# Patient Record
Sex: Male | Born: 1975
Health system: Southern US, Community
[De-identification: ages and names within clinical notes are randomized; demographics above are authoritative.]

## PROBLEM LIST (undated history)

## (undated) DIAGNOSIS — R112 Nausea with vomiting, unspecified: Secondary | ICD-10-CM

## (undated) DIAGNOSIS — I10 Essential (primary) hypertension: Secondary | ICD-10-CM

## (undated) DIAGNOSIS — K219 Gastro-esophageal reflux disease without esophagitis: Secondary | ICD-10-CM

## (undated) DIAGNOSIS — R7303 Prediabetes: Secondary | ICD-10-CM

## (undated) DIAGNOSIS — R0602 Shortness of breath: Secondary | ICD-10-CM

## (undated) DIAGNOSIS — E785 Hyperlipidemia, unspecified: Secondary | ICD-10-CM

## (undated) DIAGNOSIS — G4730 Sleep apnea, unspecified: Secondary | ICD-10-CM

## (undated) DIAGNOSIS — K589 Irritable bowel syndrome without diarrhea: Secondary | ICD-10-CM

## (undated) DIAGNOSIS — H409 Unspecified glaucoma: Secondary | ICD-10-CM

## (undated) DIAGNOSIS — M549 Dorsalgia, unspecified: Secondary | ICD-10-CM

## (undated) DIAGNOSIS — M2550 Pain in unspecified joint: Secondary | ICD-10-CM

## (undated) DIAGNOSIS — K449 Diaphragmatic hernia without obstruction or gangrene: Secondary | ICD-10-CM

## (undated) DIAGNOSIS — K829 Disease of gallbladder, unspecified: Secondary | ICD-10-CM

## (undated) DIAGNOSIS — K760 Fatty (change of) liver, not elsewhere classified: Secondary | ICD-10-CM

## (undated) DIAGNOSIS — J302 Other seasonal allergic rhinitis: Secondary | ICD-10-CM

## (undated) DIAGNOSIS — I209 Angina pectoris, unspecified: Secondary | ICD-10-CM

## (undated) DIAGNOSIS — S0300XA Dislocation of jaw, unspecified side, initial encounter: Secondary | ICD-10-CM

## (undated) DIAGNOSIS — Z9889 Other specified postprocedural states: Secondary | ICD-10-CM

## (undated) DIAGNOSIS — H9190 Unspecified hearing loss, unspecified ear: Secondary | ICD-10-CM

## (undated) HISTORY — DX: Dislocation of jaw, unspecified side, initial encounter: S03.00XA

## (undated) HISTORY — DX: Prediabetes: R73.03

## (undated) HISTORY — DX: Hyperlipidemia, unspecified: E78.5

## (undated) HISTORY — DX: Shortness of breath: R06.02

## (undated) HISTORY — PX: TONSILLECTOMY: SUR1361

## (undated) HISTORY — DX: Disease of gallbladder, unspecified: K82.9

## (undated) HISTORY — DX: Dorsalgia, unspecified: M54.9

## (undated) HISTORY — DX: Unspecified glaucoma: H40.9

## (undated) HISTORY — DX: Other seasonal allergic rhinitis: J30.2

## (undated) HISTORY — DX: Essential (primary) hypertension: I10

## (undated) HISTORY — DX: Irritable bowel syndrome, unspecified: K58.9

## (undated) HISTORY — DX: Pain in unspecified joint: M25.50

## (undated) HISTORY — DX: Fatty (change of) liver, not elsewhere classified: K76.0

## (undated) HISTORY — DX: Gastro-esophageal reflux disease without esophagitis: K21.9

## (undated) HISTORY — DX: Unspecified hearing loss, unspecified ear: H91.90

---

## 1999-10-07 ENCOUNTER — Encounter: Admission: RE | Admit: 1999-10-07 | Discharge: 1999-10-07 | Payer: Self-pay | Admitting: Otolaryngology

## 1999-10-07 ENCOUNTER — Encounter: Payer: Self-pay | Admitting: Otolaryngology

## 1999-11-24 ENCOUNTER — Other Ambulatory Visit: Admission: RE | Admit: 1999-11-24 | Discharge: 1999-11-24 | Payer: Self-pay | Admitting: Otolaryngology

## 1999-11-24 ENCOUNTER — Encounter (INDEPENDENT_AMBULATORY_CARE_PROVIDER_SITE_OTHER): Payer: Self-pay | Admitting: *Deleted

## 2000-12-04 HISTORY — PX: CHOLESTEATOMA EXCISION: SHX1345

## 2003-03-25 ENCOUNTER — Emergency Department (HOSPITAL_COMMUNITY): Admission: EM | Admit: 2003-03-25 | Discharge: 2003-03-25 | Payer: Self-pay | Admitting: Emergency Medicine

## 2003-03-25 ENCOUNTER — Encounter: Payer: Self-pay | Admitting: Emergency Medicine

## 2007-12-05 HISTORY — PX: CHOLECYSTECTOMY: SHX55

## 2010-09-14 ENCOUNTER — Encounter: Admission: RE | Admit: 2010-09-14 | Discharge: 2010-09-14 | Payer: Self-pay | Admitting: Otolaryngology

## 2011-07-14 ENCOUNTER — Other Ambulatory Visit (HOSPITAL_COMMUNITY): Payer: Self-pay | Admitting: Diagnostic Neuroimaging

## 2011-07-14 DIAGNOSIS — M542 Cervicalgia: Secondary | ICD-10-CM

## 2011-07-17 ENCOUNTER — Ambulatory Visit (HOSPITAL_COMMUNITY): Payer: Managed Care, Other (non HMO) | Attending: Diagnostic Neuroimaging | Admitting: Radiology

## 2011-07-17 DIAGNOSIS — G4730 Sleep apnea, unspecified: Secondary | ICD-10-CM | POA: Insufficient documentation

## 2011-07-17 DIAGNOSIS — R209 Unspecified disturbances of skin sensation: Secondary | ICD-10-CM

## 2011-07-17 DIAGNOSIS — M542 Cervicalgia: Secondary | ICD-10-CM

## 2011-07-17 DIAGNOSIS — K449 Diaphragmatic hernia without obstruction or gangrene: Secondary | ICD-10-CM | POA: Insufficient documentation

## 2011-07-18 ENCOUNTER — Encounter (HOSPITAL_COMMUNITY): Payer: Self-pay | Admitting: Diagnostic Neuroimaging

## 2011-10-20 ENCOUNTER — Encounter (HOSPITAL_COMMUNITY): Payer: Self-pay | Admitting: Pharmacy Technician

## 2011-10-30 ENCOUNTER — Encounter (HOSPITAL_COMMUNITY): Payer: Self-pay

## 2011-10-30 ENCOUNTER — Encounter (HOSPITAL_COMMUNITY)
Admission: RE | Admit: 2011-10-30 | Discharge: 2011-10-30 | Disposition: A | Discharge status: Home / Self Care | Payer: Managed Care, Other (non HMO) | Source: Ambulatory Visit | Attending: Otolaryngology | Admitting: Otolaryngology

## 2011-10-30 HISTORY — DX: Gastro-esophageal reflux disease without esophagitis: K21.9

## 2011-10-30 HISTORY — DX: Sleep apnea, unspecified: G47.30

## 2011-10-30 HISTORY — DX: Diaphragmatic hernia without obstruction or gangrene: K44.9

## 2011-10-30 HISTORY — DX: Shortness of breath: R06.02

## 2011-10-30 HISTORY — DX: Other specified postprocedural states: Z98.890

## 2011-10-30 HISTORY — DX: Angina pectoris, unspecified: I20.9

## 2011-10-30 HISTORY — DX: Other specified postprocedural states: R11.2

## 2011-10-30 LAB — CBC
HCT: 40.6 % (ref 39.0–52.0)
Hemoglobin: 14.3 g/dL (ref 13.0–17.0)
MCH: 30.2 pg (ref 26.0–34.0)
MCHC: 35.2 g/dL (ref 30.0–36.0)

## 2011-10-30 LAB — BASIC METABOLIC PANEL
BUN: 12 mg/dL (ref 6–23)
Chloride: 101 mEq/L (ref 96–112)
GFR calc Af Amer: >90 mL/min (ref >90)
Glucose, Bld: 89 mg/dL (ref 70–99)
Potassium: 4.6 mEq/L (ref 3.5–5.1)

## 2011-10-30 LAB — SURGICAL PCR SCREEN: Staphylococcus aureus: NEGATIVE

## 2011-10-30 NOTE — Progress Notes (Signed)
Echo done 8/12 in epic. neg

## 2011-10-30 NOTE — Pre-Procedure Instructions (Signed)
20 RUSSEL MORAIN  10/30/2011   Your procedure is scheduled on:  11/02/11  Report to [facility] at 530 AM.  Call this number if you have problems the morning of surgery: [phone]   Remember:   Do not eat food:After Midnight.  May have clear liquids: up to 4 Hours before arrival.  Clear liquids include soda, tea, black coffee, apple or grape juice, broth.  Take these medicines the morning of surgery with A SIP OF WATER: nexium, flexeril if needed  Stop fish oil and asa now if not already   Do not wear jewelry, make-up or nail polish.  Do not wear lotions, powders, or perfumes. You may wear deodorant.  Do not shave 48 hours prior to surgery.  Do not bring valuables to the hospital.  Contacts, dentures or bridgework may not be worn into surgery.  Leave suitcase in the car. After surgery it may be brought to your room.  For patients admitted to the hospital, checkout time is 11:00 AM the day of discharge.   Patients discharged the day of surgery will not be allowed to drive home.  Name and phone number of your driver:[phone]  Special Instructions: CHG Shower Use Special Wash: 1/2 bottle night before surgery and 1/2 bottle morning of surgery.   Please read over the following fact sheets that you were given: Pain Booklet, Coughing and Deep Breathing, MRSA Information and Surgical Site Infection Prevention

## 2011-11-02 ENCOUNTER — Encounter (HOSPITAL_COMMUNITY): Payer: Self-pay | Admitting: Certified Registered"

## 2011-11-02 ENCOUNTER — Encounter (HOSPITAL_COMMUNITY)
Admission: RE | Disposition: A | Discharge status: Home / Self Care | Payer: Self-pay | Source: Ambulatory Visit | Attending: Otolaryngology

## 2011-11-02 ENCOUNTER — Encounter (HOSPITAL_COMMUNITY): Payer: Self-pay

## 2011-11-02 ENCOUNTER — Ambulatory Visit (HOSPITAL_COMMUNITY): Payer: Managed Care, Other (non HMO) | Admitting: Certified Registered"

## 2011-11-02 ENCOUNTER — Encounter (HOSPITAL_COMMUNITY): Payer: Self-pay | Admitting: *Deleted

## 2011-11-02 ENCOUNTER — Observation Stay (HOSPITAL_COMMUNITY)
Admission: RE | Admit: 2011-11-02 | Discharge: 2011-11-03 | Disposition: A | Discharge status: Home / Self Care | Payer: Managed Care, Other (non HMO) | Source: Ambulatory Visit | Attending: Otolaryngology | Admitting: Otolaryngology

## 2011-11-02 DIAGNOSIS — Z01812 Encounter for preprocedural laboratory examination: Secondary | ICD-10-CM | POA: Insufficient documentation

## 2011-11-02 DIAGNOSIS — J342 Deviated nasal septum: Principal | ICD-10-CM | POA: Insufficient documentation

## 2011-11-02 DIAGNOSIS — G4733 Obstructive sleep apnea (adult) (pediatric): Secondary | ICD-10-CM | POA: Insufficient documentation

## 2011-11-02 HISTORY — PX: NASAL SEPTOPLASTY W/ TURBINOPLASTY: SHX2070

## 2011-11-02 SURGERY — SEPTOPLASTY, NOSE, WITH NASAL TURBINATE REDUCTION
Anesthesia: General | Laterality: Bilateral | Wound class: Clean Contaminated

## 2011-11-02 MED ORDER — LACTATED RINGERS IV SOLN: INTRAVENOUS | Status: DC

## 2011-11-02 MED ORDER — CYCLOBENZAPRINE HCL 5 MG PO TABS
5.0000 mg | ORAL_TABLET | Freq: Every day | ORAL | Status: DC
  Administered 2011-11-02: 10 mg via ORAL
  Filled 2011-11-02 (×2): qty 2

## 2011-11-02 MED ORDER — ONDANSETRON HCL 4 MG/2ML IJ SOLN: 4.0000 mg | INTRAMUSCULAR | Status: DC | PRN

## 2011-11-02 MED ORDER — DEXTROSE-NACL 5-0.45 % IV SOLN
INTRAVENOUS | Status: DC
  Administered 2011-11-02 (×2): via INTRAVENOUS

## 2011-11-02 MED ORDER — LIDOCAINE-EPINEPHRINE 1 %-1:100000 IJ SOLN
INTRAMUSCULAR | Status: DC | PRN
  Administered 2011-11-02: 20 mL

## 2011-11-02 MED ORDER — BACITRACIN ZINC 500 UNIT/GM EX OINT
TOPICAL_OINTMENT | CUTANEOUS | Status: DC | PRN
  Administered 2011-11-02: 1 via TOPICAL

## 2011-11-02 MED ORDER — PANTOPRAZOLE SODIUM 40 MG PO TBEC: 40.0000 mg | DELAYED_RELEASE_TABLET | Freq: Every day | ORAL | Status: DC

## 2011-11-02 MED ORDER — PROMETHAZINE HCL 25 MG/ML IJ SOLN
6.2500 mg | INTRAMUSCULAR | Status: AC | PRN
  Administered 2011-11-02 (×3): 12.5 mg via INTRAVENOUS

## 2011-11-02 MED ORDER — MIDAZOLAM HCL 5 MG/5ML IJ SOLN
INTRAMUSCULAR | Status: DC | PRN
  Administered 2011-11-02: 2 mg via INTRAVENOUS

## 2011-11-02 MED ORDER — ROCURONIUM BROMIDE 100 MG/10ML IV SOLN
INTRAVENOUS | Status: DC | PRN
  Administered 2011-11-02: 50 mg via INTRAVENOUS

## 2011-11-02 MED ORDER — CEPHALEXIN 500 MG PO CAPS
500.0000 mg | ORAL_CAPSULE | Freq: Three times a day (TID) | ORAL | Status: DC
  Administered 2011-11-02: 500 mg via ORAL
  Filled 2011-11-02 (×4): qty 1

## 2011-11-02 MED ORDER — HYDROMORPHONE HCL PF 1 MG/ML IJ SOLN
INTRAMUSCULAR | Status: AC
  Filled 2011-11-02: qty 1

## 2011-11-02 MED ORDER — KETOROLAC TROMETHAMINE 30 MG/ML IJ SOLN: 15.0000 mg | Freq: Once | INTRAMUSCULAR | Status: DC | PRN

## 2011-11-02 MED ORDER — MORPHINE SULFATE 4 MG/ML IJ SOLN
2.0000 mg | INTRAMUSCULAR | Status: DC | PRN
Start: 2011-11-02 — End: 2011-11-03

## 2011-11-02 MED ORDER — ONDANSETRON HCL 4 MG/2ML IJ SOLN
INTRAMUSCULAR | Status: DC | PRN
  Administered 2011-11-02: 4 mg via INTRAVENOUS

## 2011-11-02 MED ORDER — LACTATED RINGERS IV SOLN
INTRAVENOUS | Status: DC | PRN
  Administered 2011-11-02: 07:00:00 via INTRAVENOUS

## 2011-11-02 MED ORDER — MEPERIDINE HCL 25 MG/ML IJ SOLN: 6.2500 mg | INTRAMUSCULAR | Status: DC | PRN

## 2011-11-02 MED ORDER — PROPOFOL 10 MG/ML IV EMUL
INTRAVENOUS | Status: DC | PRN
  Administered 2011-11-02: 250 mg via INTRAVENOUS

## 2011-11-02 MED ORDER — ONDANSETRON HCL 4 MG PO TABS: 4.0000 mg | ORAL_TABLET | ORAL | Status: DC | PRN

## 2011-11-02 MED ORDER — CEPHALEXIN 500 MG PO CAPS: 500.0000 mg | ORAL_CAPSULE | Freq: Three times a day (TID) | ORAL | Status: AC

## 2011-11-02 MED ORDER — HYDROMORPHONE HCL PF 1 MG/ML IJ SOLN
0.2500 mg | INTRAMUSCULAR | Status: DC | PRN
  Administered 2011-11-02 (×2): 0.5 mg via INTRAVENOUS

## 2011-11-02 MED ORDER — GLYCOPYRROLATE 0.2 MG/ML IJ SOLN
INTRAMUSCULAR | Status: DC | PRN
Start: 2011-11-02 — End: 2011-11-02
  Administered 2011-11-02: .8 mg via INTRAVENOUS

## 2011-11-02 MED ORDER — FENTANYL CITRATE 0.05 MG/ML IJ SOLN
INTRAMUSCULAR | Status: DC | PRN
  Administered 2011-11-02: 100 ug via INTRAVENOUS
  Administered 2011-11-02: 50 ug via INTRAVENOUS

## 2011-11-02 MED ORDER — OXYCODONE-ACETAMINOPHEN 5-325 MG PO TABS
1.0000 | ORAL_TABLET | ORAL | Status: DC | PRN
  Administered 2011-11-02: 1 via ORAL
  Filled 2011-11-02: qty 1

## 2011-11-02 MED ORDER — OXYCODONE-ACETAMINOPHEN 5-500 MG PO TABS: 1.0000 | ORAL_TABLET | ORAL | Status: AC | PRN

## 2011-11-02 MED ORDER — NEOSTIGMINE METHYLSULFATE 1 MG/ML IJ SOLN
INTRAMUSCULAR | Status: DC | PRN
  Administered 2011-11-02: 5 mg via INTRAVENOUS

## 2011-11-02 MED ORDER — OXYMETAZOLINE HCL 0.05 % NA SOLN
NASAL | Status: DC | PRN
  Administered 2011-11-02: 1 via NASAL

## 2011-11-02 SURGICAL SUPPLY — 28 items
ATTRACTOMAT 16X20 MAGNETIC DRP (DRAPES) IMPLANT
CANISTER SUCTION 2500CC (MISCELLANEOUS) ×2 IMPLANT
CLOTH BEACON ORANGE TIMEOUT ST (SAFETY) ×2 IMPLANT
COAGULATOR SUCT SWTCH 10FR 6 (ELECTROSURGICAL) ×2 IMPLANT
CRADLE DONUT ADULT HEAD (MISCELLANEOUS) IMPLANT
DRESSING TELFA 8X3 (GAUZE/BANDAGES/DRESSINGS) ×2 IMPLANT
ELECT COATED BLADE 2.86 ST (ELECTRODE) IMPLANT
ELECT REM PT RETURN 9FT ADLT (ELECTROSURGICAL) ×2
ELECTRODE REM PT RTRN 9FT ADLT (ELECTROSURGICAL) ×1 IMPLANT
GLOVE BIOGEL PI IND STRL 6 (GLOVE) ×1 IMPLANT
GLOVE BIOGEL PI INDICATOR 6 (GLOVE) ×1
GLOVE ECLIPSE 7.5 STRL STRAW (GLOVE) ×2 IMPLANT
GOWN STRL NON-REIN LRG LVL3 (GOWN DISPOSABLE) ×4 IMPLANT
KIT BASIN OR (CUSTOM PROCEDURE TRAY) ×2 IMPLANT
KIT ROOM TURNOVER OR (KITS) ×2 IMPLANT
NS IRRIG 1000ML POUR BTL (IV SOLUTION) ×2 IMPLANT
PAD ARMBOARD 7.5X6 YLW CONV (MISCELLANEOUS) ×2 IMPLANT
PATTIES SURGICAL .5 X3 (DISPOSABLE) ×2 IMPLANT
PENCIL FOOT CONTROL (ELECTRODE) ×2 IMPLANT
SOLUTION ANTI FOG 6CC (MISCELLANEOUS) ×2 IMPLANT
SPECIMEN JAR SMALL (MISCELLANEOUS) IMPLANT
SUT CHROMIC 4 0 G 3 (SUTURE) ×2 IMPLANT
SUT ETHILON 3 0 PS 1 (SUTURE) ×2 IMPLANT
SUT PLAIN 4 0 ~~LOC~~ 1 (SUTURE) ×2 IMPLANT
TOWEL OR 17X24 6PK STRL BLUE (TOWEL DISPOSABLE) ×2 IMPLANT
TOWEL OR 17X26 10 PK STRL BLUE (TOWEL DISPOSABLE) ×2 IMPLANT
TRAY ENT MC OR (CUSTOM PROCEDURE TRAY) ×2 IMPLANT
WATER STERILE IRR 1000ML POUR (IV SOLUTION) ×2 IMPLANT

## 2011-11-02 NOTE — Anesthesia Postprocedure Evaluation (Signed)
  Anesthesia Post-op Note  Patient: [name] Hart  Procedure(s) Performed:  NASAL SEPTOPLASTY WITH TURBINATE REDUCTION - SEPTOPLASTY WITH BILATERAL TURBINATE REDUCTION  Patient Location: PACU  Anesthesia Type: General  Level of Consciousness: awake  Airway and Oxygen Therapy: Patient Spontanous Breathing  Post-op Pain: mild  Post-op Assessment: Post-op Vital signs reviewed  Post-op Vital Signs: stable  Complications: No apparent anesthesia complications

## 2011-11-02 NOTE — Progress Notes (Signed)
Called for lunch tray

## 2011-11-02 NOTE — H&P (Signed)
[name] Hart is an 35 y.o. male.   Chief Complaint: Deviated septum HPI: He is here for surgery for his deviated septum and turbinate hypertrophy with chronic nasal obstruction  Past Medical History  Diagnosis Date  . PONV (postoperative nausea and vomiting)   . Angina     pain in back lft arm aug 12 echo done in epic  neg  . Shortness of breath   . Sleep apnea     cpap used  since 06  . GERD (gastroesophageal reflux disease)   . Hiatal hernia     sliding hernia  . Concussion     35 yrs old    Past Surgical History  Procedure Date  . Cholecystectomy 09  . Cholesteatoma excision 02    x2  lft parotid area  . Tonsillectomy     History reviewed. No pertinent family history. Social History:  reports that he has never smoked. He has never used smokeless tobacco. He reports that he drinks alcohol. He reports that he does not use illicit drugs.  Allergies: No Known Allergies  No current facility-administered medications on file as of 11/02/2011.   No current outpatient prescriptions on file as of 11/02/2011.    No results found for this or any previous visit (from the past 48 hour(s)). No results found.  Review of Systems  Constitutional: Negative.   HENT: Negative.        Septum is deviated and Turbinates are hypertrophied  Eyes: Negative.   Respiratory: Negative.   Cardiovascular: Negative.   Skin: Negative.     Blood pressure 146/83, pulse 86, temperature 98.7 F (37.1 C), temperature source Oral, resp. rate 20, SpO2 96.00%. Physical Exam   Assessment/Plan Deviated septum and turbinate hypertrophy-clear for surgery for this condition.  Cameryn Chrisley M 11/02/2011, 7:24 AM

## 2011-11-02 NOTE — Progress Notes (Signed)
Subjective: Doing well, no complaints.  Objective: Vital signs in last 24 hours: Temp:  [97.2 F (36.2 C)-98.7 F (37.1 C)] 97.6 F (36.4 C) (11/29 1609) Pulse Rate:  [71-129] 93  (11/29 1609) Resp:  [8-30] 25  (11/29 1609) BP: (139-158)/(76-106) 150/76 mmHg (11/29 1607) SpO2:  [90 %-100 %] 95 % (11/29 1609) Weight change:     Intake/Output from previous day:   Intake/Output this shift: Total I/O In: 1120 [P.O.:120; I.V.:1000] Out: 1250 [Urine:1200; Blood:50]  PHYSICAL EXAM: Packing in place, no active bleeding. He is breathing without difficulty. He is awake and alert.  Lab Results: No results found for this basename: WBC:2,HGB:2,HCT:2,PLT:2 in the last 72 hours BMET No results found for this basename: NA:2,K:2,CL:2,CO2:2,GLUCOSE:2,BUN:2,CREATININE:2,CALCIUM:2 in the last 72 hours  Studies/Results: No results found.  Medications: I have reviewed the patient's current medications.  Assessment/Plan: Stable postop. Continue overnight observation.  LOS: 0 days   Grantland Want H 11/02/2011, 4:36 PM

## 2011-11-02 NOTE — Anesthesia Preprocedure Evaluation (Addendum)
Anesthesia Evaluation  Patient identified by MRN, date of birth, ID band Patient awake    Reviewed: Allergy & Precautions, H&P , NPO status , Patient's Chart, lab work & pertinent test results  History of Anesthesia Complications (+) PONV, PROLONGED EMERGENCE and Family history of anesthesia reaction  Airway Mallampati: II TM Distance: >3 FB Neck ROM: Full    Dental  (+) Dental Advisory Given   Pulmonary shortness of breath and with exertion, sleep apnea and Continuous Positive Airway Pressure Ventilation ,  clear to auscultation        Cardiovascular + angina Regular Normal    Neuro/Psych  Neuromuscular disease    GI/Hepatic hiatal hernia, GERD-  ,  Endo/Other    Renal/GU      Musculoskeletal   Abdominal (+) obese,   Peds  Hematology   Anesthesia Other Findings   Reproductive/Obstetrics                        Anesthesia Physical Anesthesia Plan  ASA: III  Anesthesia Plan: General   Post-op Pain Management:    Induction: Intravenous  Airway Management Planned: Video Laryngoscope Planned and Oral ETT  Additional Equipment:   Intra-op Plan:   Post-operative Plan: Extubation in OR  Informed Consent: I have reviewed the patients History and Physical, chart, labs and discussed the procedure including the risks, benefits and alternatives for the proposed anesthesia with the patient or authorized representative who has indicated his/her understanding and acceptance.   Dental advisory given  Plan Discussed with: CRNA and Surgeon  Anesthesia Plan Comments:         Anesthesia Quick Evaluation

## 2011-11-02 NOTE — Op Note (Signed)
Preop/postop diagnoses: Deviated septum Procedure: Septoplasty and submucous resection inferior turbinates Anesthesia Gen. Estimated blood loss less than 20 cc Indications patient has obstructive sleep apnea and uses CPAP. He has had nasal obstruction has been refractory medical therapy. He now wants to proceed with repair of a significantly deviated septum and turbinate reduced. He was informed risks, benefits, and options and all questions are entered and consent was obtained. Operation: Patient was taken the operating room placed supine position after general endotracheal tube anesthesia placed in supine position prepped and draped in the usual sterile manner. The oxymetazoline pledgets were placed in the nose and septum and inferior turbinates were injected with 1% lidocaine with 1 100,000 epinephrine. A right hemitransfixion incision was performed raised a mucoperichondrial and ostial flap. The cartilage was divided about 2 cm posterior to the caudal strut and this deviated portion of his cartilage and bone were removed with the Glorious Peach and the Jansen-Middleton forceps. The inferior spur was removed with a 4 mm osteotome. This corrected the septal deflection. The turbinates are infractured midline incision made with a 15 blade the posterior flap elevated superiorly and the inferior mucosa and bone were removed the turbinate scissors. The edge was cauterized with suction cautery and the flap was laid back down over the surface and outfracture bilaterally. There was still oozing from the edge of the turbinate soaked Telfa rolls and bacitracin was placed into the nose and secured with 3-0 nylon. The nasopharynx was suctioned of all blood and debris prior to the placement of packing. The patient is an awake and rotatory stable condition counts correct.

## 2011-11-02 NOTE — Preoperative (Signed)
Beta Blockers   Reason not to administer Beta Blockers:Not Applicable 

## 2011-11-02 NOTE — Transfer of Care (Signed)
Immediate Anesthesia Transfer of Care Note  Patient: [name]  Procedure(s) Performed:  NASAL SEPTOPLASTY WITH TURBINATE REDUCTION - SEPTOPLASTY WITH BILATERAL TURBINATE REDUCTION  Patient Location: PACU  Anesthesia Type: General  Level of Consciousness: awake, alert  and oriented  Airway & Oxygen Therapy: Patient Spontanous Breathing and Patient connected to face mask oxygen  Post-op Assessment: Report given to PACU RN  Post vital signs: Reviewed and stable  Complications: No apparent anesthesia complications

## 2011-11-03 ENCOUNTER — Encounter (HOSPITAL_COMMUNITY): Payer: Self-pay | Admitting: Otolaryngology

## 2011-11-03 NOTE — Progress Notes (Signed)
Patient ID: [name] Hart, male   DOB: 1976-02-10, 35 y.o.   MRN: [id number] He is doing well. He's had minimal bleeding. Only one pad change since last night at 12:30. He apparently did not have any significant desaturation. He has no pain or swelling. The packing was removed from both sides. He had some bleeding which should stop over the next 15 or 20 minutes. His CPAP issues were discussed and he will wait at least a week before starting CPAP at home. He will followup in 1-2 weeks.

## 2011-11-03 NOTE — Discharge Summary (Signed)
Physician Discharge Summary  Patient ID: [name] Hart MRN: [id number] DOB/AGE: [birth year] 35 y.o.  Admit date: 11/02/2011 Discharge date: 11/03/2011  Admission Diagnoses:  Discharge Diagnoses:  Active Problems:  * No active hospital problems. *    Discharged Condition: good  Hospital Course: Patient was admitted after nasal surgery. He did well postoperatively. No desaturation. He is now ready for discharge after removal of the packing. He will followup in 1-2 weeks.  Consults: none  Significant Diagnostic Studies: none  Treatments: none  Discharge Exam: Blood pressure 137/72, pulse 93, temperature 97.6 F (36.4 C), temperature source Oral, resp. rate 18, height 5\' 4"  (1.626 m), weight 116.348 kg (256 lb 8 oz), SpO2 96.00%. He is awake and alert. Packing is removed from the nose bilaterally. There was some bleeding but it was tapering off after a few minutes. Oral cavity/oropharynx-no lesions. Neck is without adenopathy. Heart is normal rhythm. Lungs are clear.  Disposition: Final discharge disposition not confirmed   Current Discharge Medication List    START taking these medications   Details  cephALEXin (KEFLEX) 500 MG capsule Take 1 capsule (500 mg total) by mouth 3 (three) times daily. Qty: 15 capsule, Refills: 0    oxycodone-acetaminophen (ROXICET) 5-500 MG per tablet Take 1 tablet by mouth every 4 (four) hours as needed for pain. Qty: 30 tablet, Refills: 0      CONTINUE these medications which have NOT CHANGED   Details  aspirin EC 81 MG tablet Take 81 mg by mouth daily.      atorvastatin (LIPITOR) 10 MG tablet Take 10 mg by mouth daily.      cyclobenzaprine (FLEXERIL) 5 MG tablet Take 5-10 mg by mouth at bedtime.      esomeprazole (NEXIUM) 40 MG capsule Take 40 mg by mouth daily before breakfast.      Omega-3 Fatty Acids (FISH OIL) 1000 MG CAPS Take 1,000 mg by mouth daily.         Follow-up Information    Follow up with Quinn Bartling M. Call in 2  weeks.   Contact information:   [facility] [address] [place] [address] [phone]          Signed: Leonette Most 11/03/2011, 7:17 AM

## 2011-11-03 NOTE — Progress Notes (Signed)
Discharged patient. Home discharge instruction given, no questions verbalized. Alert and oriented, nasal dressing in place, denies any pain.

## 2012-05-15 ENCOUNTER — Ambulatory Visit: Payer: Managed Care, Other (non HMO) | Admitting: Physical Therapy

## 2012-05-29 ENCOUNTER — Ambulatory Visit: Payer: Managed Care, Other (non HMO) | Attending: Diagnostic Neuroimaging | Admitting: Physical Therapy

## 2012-05-29 DIAGNOSIS — IMO0001 Reserved for inherently not codable concepts without codable children: Secondary | ICD-10-CM | POA: Insufficient documentation

## 2012-05-29 DIAGNOSIS — R293 Abnormal posture: Secondary | ICD-10-CM | POA: Insufficient documentation

## 2012-06-04 ENCOUNTER — Ambulatory Visit: Payer: Managed Care, Other (non HMO) | Admitting: Physical Therapy

## 2012-06-13 ENCOUNTER — Ambulatory Visit: Payer: Managed Care, Other (non HMO) | Admitting: Rehabilitative and Restorative Service Providers"

## 2012-06-19 ENCOUNTER — Ambulatory Visit: Payer: Managed Care, Other (non HMO) | Attending: Diagnostic Neuroimaging | Admitting: Physical Therapy

## 2012-06-19 DIAGNOSIS — R293 Abnormal posture: Secondary | ICD-10-CM | POA: Insufficient documentation

## 2012-06-19 DIAGNOSIS — IMO0001 Reserved for inherently not codable concepts without codable children: Secondary | ICD-10-CM | POA: Insufficient documentation

## 2012-06-26 ENCOUNTER — Ambulatory Visit: Payer: Managed Care, Other (non HMO) | Admitting: Physical Therapy

## 2012-07-03 ENCOUNTER — Ambulatory Visit: Payer: Managed Care, Other (non HMO) | Admitting: Physical Therapy

## 2012-07-04 ENCOUNTER — Ambulatory Visit: Payer: Managed Care, Other (non HMO) | Admitting: Physical Therapy

## 2012-07-10 ENCOUNTER — Ambulatory Visit: Payer: Managed Care, Other (non HMO) | Attending: Diagnostic Neuroimaging | Admitting: Physical Therapy

## 2012-07-10 DIAGNOSIS — R293 Abnormal posture: Secondary | ICD-10-CM | POA: Insufficient documentation

## 2012-07-10 DIAGNOSIS — IMO0001 Reserved for inherently not codable concepts without codable children: Secondary | ICD-10-CM | POA: Insufficient documentation

## 2012-07-17 ENCOUNTER — Ambulatory Visit: Payer: Managed Care, Other (non HMO) | Admitting: Physical Therapy

## 2012-07-31 ENCOUNTER — Ambulatory Visit: Payer: Managed Care, Other (non HMO) | Admitting: Physical Therapy

## 2012-08-07 ENCOUNTER — Ambulatory Visit: Payer: Managed Care, Other (non HMO) | Attending: Diagnostic Neuroimaging | Admitting: Physical Therapy

## 2012-08-07 DIAGNOSIS — R293 Abnormal posture: Secondary | ICD-10-CM | POA: Insufficient documentation

## 2012-08-07 DIAGNOSIS — IMO0001 Reserved for inherently not codable concepts without codable children: Secondary | ICD-10-CM | POA: Insufficient documentation

## 2012-08-14 ENCOUNTER — Ambulatory Visit: Payer: Managed Care, Other (non HMO) | Admitting: Physical Therapy

## 2012-08-21 ENCOUNTER — Ambulatory Visit: Payer: Managed Care, Other (non HMO) | Admitting: Physical Therapy

## 2012-08-28 ENCOUNTER — Ambulatory Visit: Payer: Managed Care, Other (non HMO) | Admitting: Physical Therapy

## 2016-04-03 DIAGNOSIS — H40033 Anatomical narrow angle, bilateral: Secondary | ICD-10-CM | POA: Diagnosis not present

## 2016-04-26 DIAGNOSIS — J22 Unspecified acute lower respiratory infection: Secondary | ICD-10-CM | POA: Diagnosis not present

## 2016-04-26 DIAGNOSIS — M79602 Pain in left arm: Secondary | ICD-10-CM | POA: Diagnosis not present

## 2016-04-26 DIAGNOSIS — R0602 Shortness of breath: Secondary | ICD-10-CM | POA: Diagnosis not present

## 2016-05-16 DIAGNOSIS — E7800 Pure hypercholesterolemia, unspecified: Secondary | ICD-10-CM | POA: Diagnosis not present

## 2016-05-16 DIAGNOSIS — R0789 Other chest pain: Secondary | ICD-10-CM | POA: Diagnosis not present

## 2016-05-16 DIAGNOSIS — I10 Essential (primary) hypertension: Secondary | ICD-10-CM | POA: Diagnosis not present

## 2016-05-16 DIAGNOSIS — R0602 Shortness of breath: Secondary | ICD-10-CM | POA: Diagnosis not present

## 2016-05-23 DIAGNOSIS — S39012A Strain of muscle, fascia and tendon of lower back, initial encounter: Secondary | ICD-10-CM | POA: Diagnosis not present

## 2016-05-24 DIAGNOSIS — R0602 Shortness of breath: Secondary | ICD-10-CM | POA: Diagnosis not present

## 2016-06-02 DIAGNOSIS — R0789 Other chest pain: Secondary | ICD-10-CM | POA: Diagnosis not present

## 2016-06-07 DIAGNOSIS — R0602 Shortness of breath: Secondary | ICD-10-CM | POA: Diagnosis not present

## 2016-06-07 DIAGNOSIS — E7800 Pure hypercholesterolemia, unspecified: Secondary | ICD-10-CM | POA: Diagnosis not present

## 2016-06-07 DIAGNOSIS — R0789 Other chest pain: Secondary | ICD-10-CM | POA: Diagnosis not present

## 2016-06-07 DIAGNOSIS — I10 Essential (primary) hypertension: Secondary | ICD-10-CM | POA: Diagnosis not present

## 2016-06-27 DIAGNOSIS — E119 Type 2 diabetes mellitus without complications: Secondary | ICD-10-CM | POA: Diagnosis not present

## 2016-06-27 DIAGNOSIS — E785 Hyperlipidemia, unspecified: Secondary | ICD-10-CM | POA: Diagnosis not present

## 2016-07-17 DIAGNOSIS — M5441 Lumbago with sciatica, right side: Secondary | ICD-10-CM | POA: Diagnosis not present

## 2016-07-17 DIAGNOSIS — M545 Low back pain: Secondary | ICD-10-CM | POA: Diagnosis not present

## 2016-08-09 DIAGNOSIS — J069 Acute upper respiratory infection, unspecified: Secondary | ICD-10-CM | POA: Diagnosis not present

## 2016-08-31 DIAGNOSIS — M5441 Lumbago with sciatica, right side: Secondary | ICD-10-CM | POA: Diagnosis not present

## 2016-08-31 DIAGNOSIS — R200 Anesthesia of skin: Secondary | ICD-10-CM | POA: Diagnosis not present

## 2016-09-29 DIAGNOSIS — R209 Unspecified disturbances of skin sensation: Secondary | ICD-10-CM | POA: Diagnosis not present

## 2016-10-02 DIAGNOSIS — H40233 Intermittent angle-closure glaucoma, bilateral: Secondary | ICD-10-CM | POA: Diagnosis not present

## 2016-10-02 DIAGNOSIS — E119 Type 2 diabetes mellitus without complications: Secondary | ICD-10-CM | POA: Diagnosis not present

## 2016-10-03 DIAGNOSIS — E7800 Pure hypercholesterolemia, unspecified: Secondary | ICD-10-CM | POA: Diagnosis not present

## 2016-10-03 DIAGNOSIS — E119 Type 2 diabetes mellitus without complications: Secondary | ICD-10-CM | POA: Diagnosis not present

## 2016-10-06 DIAGNOSIS — Z23 Encounter for immunization: Secondary | ICD-10-CM | POA: Diagnosis not present

## 2016-10-06 DIAGNOSIS — G629 Polyneuropathy, unspecified: Secondary | ICD-10-CM | POA: Diagnosis not present

## 2016-10-09 DIAGNOSIS — M6588 Other synovitis and tenosynovitis, other site: Secondary | ICD-10-CM | POA: Diagnosis not present

## 2016-10-17 ENCOUNTER — Other Ambulatory Visit: Payer: Self-pay | Admitting: Family Medicine

## 2016-10-17 DIAGNOSIS — M549 Dorsalgia, unspecified: Secondary | ICD-10-CM

## 2016-10-18 ENCOUNTER — Other Ambulatory Visit: Payer: Self-pay | Admitting: Family Medicine

## 2016-10-18 DIAGNOSIS — M549 Dorsalgia, unspecified: Secondary | ICD-10-CM

## 2016-10-23 ENCOUNTER — Ambulatory Visit
Admission: RE | Admit: 2016-10-23 | Discharge: 2016-10-23 | Disposition: A | Discharge status: Home / Self Care | Payer: BLUE CROSS/BLUE SHIELD | Source: Ambulatory Visit | Attending: Family Medicine | Admitting: Family Medicine

## 2016-10-23 DIAGNOSIS — M549 Dorsalgia, unspecified: Secondary | ICD-10-CM

## 2016-10-23 DIAGNOSIS — Z01818 Encounter for other preprocedural examination: Secondary | ICD-10-CM | POA: Diagnosis not present

## 2016-10-28 ENCOUNTER — Ambulatory Visit
Admission: RE | Admit: 2016-10-28 | Discharge: 2016-10-28 | Disposition: A | Discharge status: Home / Self Care | Payer: BLUE CROSS/BLUE SHIELD | Source: Ambulatory Visit | Attending: Family Medicine | Admitting: Family Medicine

## 2016-10-28 DIAGNOSIS — M549 Dorsalgia, unspecified: Secondary | ICD-10-CM

## 2016-10-28 DIAGNOSIS — M5136 Other intervertebral disc degeneration, lumbar region: Secondary | ICD-10-CM | POA: Diagnosis not present

## 2016-10-31 ENCOUNTER — Encounter: Payer: Self-pay | Admitting: Diagnostic Neuroimaging

## 2016-10-31 ENCOUNTER — Ambulatory Visit (INDEPENDENT_AMBULATORY_CARE_PROVIDER_SITE_OTHER): Payer: BLUE CROSS/BLUE SHIELD | Admitting: Diagnostic Neuroimaging

## 2016-10-31 VITALS — BP 106/76 | HR 77 | Ht 64.0 in | Wt 279.0 lb

## 2016-10-31 DIAGNOSIS — Z6841 Body Mass Index (BMI) 40.0 and over, adult: Secondary | ICD-10-CM

## 2016-10-31 DIAGNOSIS — G4733 Obstructive sleep apnea (adult) (pediatric): Secondary | ICD-10-CM

## 2016-10-31 DIAGNOSIS — R202 Paresthesia of skin: Secondary | ICD-10-CM | POA: Diagnosis not present

## 2016-10-31 DIAGNOSIS — E669 Obesity, unspecified: Secondary | ICD-10-CM

## 2016-10-31 DIAGNOSIS — IMO0001 Reserved for inherently not codable concepts without codable children: Secondary | ICD-10-CM

## 2016-10-31 DIAGNOSIS — R200 Anesthesia of skin: Secondary | ICD-10-CM

## 2016-10-31 NOTE — Progress Notes (Signed)
[facility]  PATIENT: [name] Hart DOB: May 05, 1976  REFERRING CLINICIAN: Terri Piedra, NP HISTORY FROM: patient  REASON FOR VISIT: new consult / existing patient    HISTORICAL  CHIEF COMPLAINT:  Chief Complaint  Patient presents with  . Neuropathy    rm 7, New Pt- last seen 2013, "numbness, tingling arms, legs, low back for several months"    HISTORY OF PRESENT ILLNESS:   NEW HPI (10/31/16): 40 year old male with intermittent numbness. 2-3 months ago, having more right low back pain, spasm, with pain radiating to the right hip and thigh. Worse with standing and walking. MRI lumbar spine was overall unremarkable. Also with intermittent bilateral hand, arm, neck and back numbness. A1c slightly higher at 6.2. Also with OSA, on CPAP (pressure 4cm H2O) since 2006, but he thinks he needs a new machine.   UPDATE 10/21/12: Doing about the same. Dizziness is still random. Mainly at work. Some intermittent migraine HA (1-2 days per month). Some right sided eye blurred vision and fatigue. Going to turn in CPAP auto-titration tomorrow.  UPDATE 07/31/12: NEW PROBLEM: 40 year old male with history of sleep apnea, hypercholesterolemia, borderline diabetes, obesity, here for evaluation of new onset dizziness and headache for past 2 months. Patient describes intermittent lightheadedness, room spinning, side to side movement sensation, sometimes with blurred vision. Episodes occurred when playing video games, working on the computer or talking with someone. Physical activity and exertion did not seem to aggravate the problem. Position motion do not seem to aggravate the problem. He has had some mild tension headache, mild nausea with these episodes. He has prior history of intermittent migraine headaches. Other aggravating factors include new addition of drinking half a pot coffee per day around the time of onset of these new symptoms.  UPDATE 01/17/12: Sxs resolved, but now worsened  since heavier work stress and hrs. More time on computer.   UPDATE 08/23/11: Still with intermittent left facial numbness, ususally with stress.  Labs, MRI, tesing reviewed.  PRIOR HPI: 40 year old right-handed male with history of sleep apnea, acid reflux, hiatal hernia, left cholesteatoma status post resection, here for evaluation of left face and left arm numbness since February 2011. Patient had episode of numbness in the left face, neck and left posterior arm in Feb 2011.  Symptoms lasted for several weeks and then subsided. Approximately April 2012 he had a flare up of this similar symptom, with pain and tightness in his left posterior neck and shoulder, numbness in his left face and left arm. He has had intermittent symptoms lasting for hours at a time. He feels a "knot" in his left cervical paraspinal region. He denies right-sided numbness.  He denies weakness, slurred speech, balance or coordination problems.  Sxs seem to worsen with physical activity.   REVIEW OF SYSTEMS: Full 14 system review of systems performed and negative with exception of: Numbness snoring dizziness anxiety not asleep hearing loss ringing in ears weight gain.  ALLERGIES: Allergies  Allergen Reactions  . Clindamycin Itching and Rash  . Pantoprazole Rash    HOME MEDICATIONS: Outpatient Medications Prior to Visit  Medication Sig Dispense Refill  . atorvastatin (LIPITOR) 10 MG tablet Take 10 mg by mouth daily.      [name] esomeprazole (NEXIUM) 40 MG capsule Take 40 mg by mouth daily before breakfast.      . Omega-3 Fatty Acids (FISH OIL) 1000 MG CAPS Take 1,000 mg by mouth daily.      [name] aspirin EC 81 MG tablet  Take 81 mg by mouth daily.      . cyclobenzaprine (FLEXERIL) 5 MG tablet Take 5-10 mg by mouth at bedtime.       No facility-administered medications prior to visit.     PAST MEDICAL HISTORY: Past Medical History:  Diagnosis Date  . Angina    pain in back lft arm aug 12 echo done in epic  neg  .  Concussion    40 yrs old  . GERD (gastroesophageal reflux disease)   . Hiatal hernia    sliding hernia  . Hypertension   . PONV (postoperative nausea and vomiting)   . Shortness of breath   . Sleep apnea    cpap used  since 06    PAST SURGICAL HISTORY: Past Surgical History:  Procedure Laterality Date  . CHOLECYSTECTOMY  09  . CHOLESTEATOMA EXCISION  02   x2  lft parotid area  . NASAL SEPTOPLASTY W/ TURBINOPLASTY  11/02/2011   Procedure: NASAL SEPTOPLASTY WITH TURBINATE REDUCTION;  Surgeon: Leonette MostJohn M Byers;  Location: [facility] OR;  Service: ENT;  Laterality: Bilateral;  SEPTOPLASTY WITH BILATERAL TURBINATE REDUCTION  . TONSILLECTOMY      FAMILY HISTORY: Family History  Problem Relation Age of Onset  . Hypertension Father   . Diabetes Father     SOCIAL HISTORY:  Social History   Social History  . Marital status: Married    Spouse name: April  . Number of children: 2  . Years of education: 7218   Occupational History  .      [employer]   Social History Main Topics  . Smoking status: Never Smoker  . Smokeless tobacco: Never Used  . Alcohol use Yes     Comment: light/occas  . Drug use: No  . Sexual activity: Yes   Other Topics Concern  . Not on file   Social History Narrative   Lives with family   Caffeine - coffee, 2 20 oz cups daily; sodas, 3+ daily     PHYSICAL EXAM  GENERAL EXAM/CONSTITUTIONAL: Vitals:  Vitals:   10/31/16 0904  BP: 106/76  Pulse: 77  Weight: 279 lb (126.6 kg)  Height: 5\' 4"  (1.626 m)    Wt Readings from Last 3 Encounters:  10/31/16 279 lb (126.6 kg)  11/02/11 256 lb 8 oz (116.3 kg)  10/30/11 262 lb 9.1 oz (119.1 kg)     Body mass index is 47.89 kg/m.  Visual Acuity Screening   Right eye Left eye Both eyes  Without correction:     With correction: 20/30 20/20      Patient is in no distress; well developed, nourished and groomed; neck is supple  CARDIOVASCULAR:  Examination of carotid arteries is normal; no carotid  bruits  Regular rate and rhythm, no murmurs  Examination of peripheral vascular system by observation and palpation is normal  EYES:  Ophthalmoscopic exam of optic discs and posterior segments is normal; no papilledema or hemorrhages  MUSCULOSKELETAL:  Gait, strength, tone, movements noted in Neurologic exam below  NEUROLOGIC: MENTAL STATUS:  No flowsheet data found.  awake, alert, oriented to person, place and time  recent and remote memory intact  normal attention and concentration  language fluent, comprehension intact, naming intact,   fund of knowledge appropriate  CRANIAL NERVE:   2nd - no papilledema on fundoscopic exam  2nd, 3rd, 4th, 6th - pupils equal and reactive to light, visual fields full to confrontation, extraocular muscles intact, no nystagmus  5th - facial sensation symmetric  7th -  facial strength symmetric  8th - hearing intact  9th - palate elevates symmetrically, uvula midline  11th - shoulder shrug symmetric  12th - tongue protrusion midline  MOTOR:   normal bulk and tone, full strength in the BUE, BLE  SENSORY:   normal and symmetric to light touch, temperature, vibration  COORDINATION:   finger-nose-finger, fine finger movements normal  REFLEXES:   deep tendon reflexes present and symmetric  GAIT/STATION:   narrow based gait; able to walk on toes, heels and tandem; romberg is negative     DIAGNOSTIC DATA (LABS, IMAGING, TESTING) - I reviewed patient records, labs, notes, testing and imaging myself where available.  Lab Results  Component Value Date   WBC 8.5 10/30/2011   HGB 14.3 10/30/2011   HCT 40.6 10/30/2011   MCV 85.7 10/30/2011   PLT 294 10/30/2011      Component Value Date/Time   NA 140 10/30/2011 1600   K 4.6 10/30/2011 1600   CL 101 10/30/2011 1600   CO2 27 10/30/2011 1600   GLUCOSE 89 10/30/2011 1600   BUN 12 10/30/2011 1600   CREATININE 0.87 10/30/2011 1600   CALCIUM 10.1 10/30/2011 1600    GFRNONAA >90 10/30/2011 1600   GFRAA >90 10/30/2011 1600   No results found for: CHOL, HDL, LDLCALC, LDLDIRECT, TRIG, CHOLHDL No results found for: ZOXW9UHGBA1C No results found for: VITAMINB12 No results found for: TSH   07/26/11 MRI brain  - normal  07/26/11 MRA head  - normal  07/26/11 MRI cervical  - Equivocal MRI cervical spine (without contrast) demonstrating mild disc bulging at C5-6 and C6-7.    08/09/11 carotid u/s - negative for ICA stenosis  10/28/16 MRI lumbar spine  - Mild L5-S1 disc degeneration without spinal canal or neural foraminal stenosis.     ASSESSMENT AND PLAN  40 y.o. year old male here with severe obesity, borderline diabetes, sleep apnea, here for evaluation of intermittent numbness and tingling. Will proceed with further workup and treatment options.   Ddx: diabetic neuropathy, obesity neuropathy, lumbar radiculopathy  1. Obstructive sleep apnea      PLAN: - agree with PT evaluation - advised on strategies for nutrition, physical activity, stress mgmt - will refer to sleep clinic to establish for OSA eval and treatment - consider referral to medical weight loss mgmt clinic  Orders Placed This Encounter  Procedures  . Ambulatory referral to Sleep Studies   Return in about 3 months (around 01/31/2017).    Suanne MarkerVIKRAM R. PENUMALLI, MD 10/31/2016, 9:26 AM Certified in Neurology, Neurophysiology and Neuroimaging  [facility] [address], [address] [place], [place] [id number] [phone]

## 2016-10-31 NOTE — Patient Instructions (Signed)
Thank you for coming to see [name] at [facility]. I hope we have been able to provide you high quality care today.  You may receive a patient satisfaction survey over the next few weeks. We would appreciate your feedback and comments so that we may continue to improve ourselves and the health of our patients.  - I will setup sleep clinic evaluation  - continue physical therapy    ~~~~~~~~~~~~~~~~~~~~~~~~~~~~~~~~~~~~~~~~~~~~~~~~~~~~~~~~~~~~~~~~~  DR. Krystalynn Ridgeway'S GUIDE TO HAPPY AND HEALTHY LIVING These are some of my general health and wellness recommendations. Some of them may apply to you better than others. Please use common sense as you try these suggestions and feel free to ask me any questions.   ACTIVITY/FITNESS Mental, social, emotional and physical stimulation are very important for brain and body health. Try learning a new activity (arts, music, language, sports, games).  Keep moving your body to the best of your abilities. You can do this at home, inside or outside, the park, community center, gym or anywhere you like. Consider a physical therapist or personal trainer to get started. Consider the app Sworkit. Fitness trackers such as smart-watches, smart-phones or Fitbits can help as well.   NUTRITION Eat more plants: colorful vegetables, nuts, seeds and berries.  Eat less sugar, salt, preservatives and processed foods.  Avoid toxins such as cigarettes and alcohol.  Drink water when you are thirsty. Warm water with a slice of lemon is an excellent morning drink to start the day.  Consider these websites for more information The Nutrition Source (https://www.henry-hernandez.biz/) Precision Nutrition (WindowBlog.ch)   RELAXATION Consider practicing mindfulness meditation or other relaxation techniques such as deep breathing, prayer, yoga, tai chi, massage. See website mindful.org or the apps Headspace or Calm to  help get started.   SLEEP Try to get at least 7-8+ hours sleep per day. Regular exercise and reduced caffeine will help you sleep better. Practice good sleep hygeine techniques. See website sleep.org for more information.   PLANNING Prepare estate planning, living will, healthcare POA documents. Sometimes this is best planned with the help of an attorney. Theconversationproject.org and agingwithdignity.org are excellent resources.

## 2016-11-05 ENCOUNTER — Encounter: Payer: Self-pay | Admitting: Neurology

## 2016-11-06 ENCOUNTER — Encounter: Payer: Self-pay | Admitting: Neurology

## 2016-11-06 ENCOUNTER — Ambulatory Visit (INDEPENDENT_AMBULATORY_CARE_PROVIDER_SITE_OTHER): Payer: BLUE CROSS/BLUE SHIELD | Admitting: Neurology

## 2016-11-06 VITALS — BP 143/96 | HR 86 | Resp 20 | Ht 64.0 in | Wt 276.0 lb

## 2016-11-06 DIAGNOSIS — Z9989 Dependence on other enabling machines and devices: Secondary | ICD-10-CM

## 2016-11-06 DIAGNOSIS — Z9189 Other specified personal risk factors, not elsewhere classified: Secondary | ICD-10-CM | POA: Diagnosis not present

## 2016-11-06 DIAGNOSIS — R519 Headache, unspecified: Secondary | ICD-10-CM

## 2016-11-06 DIAGNOSIS — R51 Headache: Secondary | ICD-10-CM

## 2016-11-06 DIAGNOSIS — Z789 Other specified health status: Secondary | ICD-10-CM

## 2016-11-06 DIAGNOSIS — R400 Somnolence: Secondary | ICD-10-CM

## 2016-11-06 DIAGNOSIS — G4733 Obstructive sleep apnea (adult) (pediatric): Secondary | ICD-10-CM

## 2016-11-06 DIAGNOSIS — R351 Nocturia: Secondary | ICD-10-CM | POA: Diagnosis not present

## 2016-11-06 NOTE — Patient Instructions (Addendum)
Based on your symptoms and your exam I believe you are still at risk for obstructive sleep apnea or OSA, and I think we should proceed with a sleep study to determine how severe it is. If you have more than mild OSA, I want you to consider treatment with CPAP. Please remember, the risks and ramifications of moderate to severe obstructive sleep apnea or OSA are: Cardiovascular disease, including congestive heart failure, stroke, difficult to control hypertension, arrhythmias, and even type 2 diabetes has been linked to untreated OSA. Sleep apnea causes disruption of sleep and sleep deprivation in most cases, which, in turn, can cause recurrent headaches, problems with memory, mood, concentration, focus, and vigilance. Most people with untreated sleep apnea report excessive daytime sleepiness, which can affect their ability to drive. Please do not drive if you feel sleepy.   I will likely see you back after your sleep study to go over the test results and where to go from there. We will call you after your sleep study to advise about the results (most likely, you will hear from Lafonda Mossesiana, my nurse) and to set up an appointment at the time, as necessary.    Our sleep lab administrative assistant, Alvis LemmingsDawn will meet with you or call you to schedule your sleep study. If you don't hear back from her by next week please feel free to call her at [phone]. This is her direct line and please leave a message with your phone number to call back if you get the voicemail box. She will call back as soon as possible.   I do recommend, that you reduce your caffeine intake and increase your water intake.

## 2016-11-06 NOTE — Progress Notes (Signed)
Subjective:    Patient ID: [name] Hart is a 40 y.o. male.  HPI     [name] FoleySaima Ganon Demasi, MD, PhD [facility] [address], [address] [address] [place], [place] [address]  Dear Satira SarkVikram,   I saw your patient, [name] Hart, upon your kind request in my clinic today for initial consultation of his sleep disorder, in particular, reevaluation of his prior diagnosis of OSA. The patient is unaccompanied today. As you know, [name] Hart is a 40 year old right-handed gentleman with an underlying medical history of concussion is a preschooler, reflux disease, hiatal hernia, hypertension, history of angina, dizziness, and recurrent headaches, who was previously diagnosed with obstructive sleep apnea over 10 years ago and placed on CPAP therapy. Prior sleep study results are not available for my review. I reviewed his CPAP compliance data is not available for my review today. I reviewed your office note from 10/31/2016. He reports full compliance with CPAP therapy but has had interim weight gain. He also most likely needs an updated machine, humidifier is not working. Epworth sleepiness score is 11 out of 24, fatigue score is 38 out of 63 today. He drinks quite a bit of caffeine, 2 20 oz coffee in AM, about 4 cans of Dr. Reino KentPepper and decaff tea in the evening.  In the past couple of years had weight gain, about 14 lb.  Original sleep study was in 2002 or 2003, had CPAP at 10 cm then, moved to [place] and had pressure increase and a new machine around 2006. [place] in [place] in [place] or so, pressure was increased to 14 cm. Current machine is about 173 1/38 years old. He has been using a fullface mask, he has had problems with mouth opening at night. Unfortunately, his humidifier chamber is defective and he has not been able to use the humidity in the past 8 months and does wake up with significant dry mouth. He goes to bed between 10:30 and 11 and wake up time is between 6 and 6:30. He is an  Art gallery managerengineer. He lives at home with his wife and 2 children, 2 boys, ages 2510 and 2. He is a never smoker but was exposed to secondhand smoke through both parents he states. He drinks alcohol occasionally but does strength quite a bit of sodas and coffee and also some caffeinated and decaf tea. He denies frank restless leg symptoms. He has nocturia about once per night, sometimes twice per night, and has occasional morning headaches. He does not recall a family history of OSA.   His Past Medical History Is Significant For: Past Medical History:  Diagnosis Date  . Angina    pain in back lft arm aug 12 echo done in epic  neg  . Concussion    40 yrs old  . GERD (gastroesophageal reflux disease)   . Hiatal hernia    sliding hernia  . Hypertension   . PONV (postoperative nausea and vomiting)   . Shortness of breath   . Sleep apnea    cpap used  since 06    His Past Surgical History Is Significant For: Past Surgical History:  Procedure Laterality Date  . CHOLECYSTECTOMY  09  . CHOLESTEATOMA EXCISION  02   x2  lft parotid area  . NASAL SEPTOPLASTY W/ TURBINOPLASTY  11/02/2011   Procedure: NASAL SEPTOPLASTY WITH TURBINATE REDUCTION;  Surgeon: Leonette MostJohn M Byers;  Location: [facility] OR;  Service: ENT;  Laterality: Bilateral;  SEPTOPLASTY WITH BILATERAL TURBINATE REDUCTION  .  TONSILLECTOMY      His Family History Is Significant For: Family History  Problem Relation Age of Onset  . Hypertension Father   . Diabetes Father     His Social History Is Significant For: Social History   Social History  . Marital status: Married    Spouse name: April  . Number of children: 2  . Years of education: 31   Occupational History  .      [employer]   Social History Main Topics  . Smoking status: Never Smoker  . Smokeless tobacco: Never Used  . Alcohol use Yes     Comment: light/occas  . Drug use: No  . Sexual activity: Yes   Other Topics Concern  . None   Social History Narrative   Lives with family    Caffeine - coffee, 2 20 oz cups daily; sodas, 3+ daily    His Allergies Are:  Allergies  Allergen Reactions  . Clindamycin Itching and Rash  . Pantoprazole Rash  :   His Current Medications Are:  Outpatient Encounter Prescriptions as of 11/06/2016  Medication Sig  . atorvastatin (LIPITOR) 10 MG tablet Take 10 mg by mouth daily.    . COMBIGAN 0.2-0.5 % ophthalmic solution ONE DROP IN [facility] EYE TWICE DAILY  . esomeprazole (NEXIUM) 40 MG capsule Take 40 mg by mouth daily before breakfast.    . lisinopril (PRINIVIL,ZESTRIL) 5 MG tablet 5 mg daily.  . meloxicam (MOBIC) 15 MG tablet Take 15 mg by mouth.  . metFORMIN (GLUCOPHAGE) 500 MG tablet 500 mg daily.  . Omega-3 Fatty Acids (FISH OIL) 1000 MG CAPS Take 1,000 mg by mouth daily.    [name] aspirin EC 81 MG tablet Take 81 mg by mouth daily.    . [DISCONTINUED] gabapentin (NEURONTIN) 100 MG capsule Take 100 mg by mouth as needed.   No facility-administered encounter medications on file as of 11/06/2016.   :  Review of Systems:  Out of a complete 14 point review of systems, all are reviewed and negative with the exception of these symptoms as listed below: Review of Systems  Neurological: Positive for numbness.       Pt presents today to discuss the possibility of getting a new sleep study. He has been diagnosed with sleep apnea in the past and does report snoring. He is a current cpap user and uses [facility] as his DME, but is interested in switching DMEs, if possible. Pt's current cpap is estimated by the pt to be about 67.38 years old. A current sleep study is not available. The sleep study is approximately 38 years old. Epworth Sleepiness Scale 0= would never doze 1= slight chance of dozing 2= moderate chance of dozing 3= high chance of dozing  Sitting and reading: 1 Watching TV: 1 Sitting inactive in a public place (ex. Theater or meeting): 2 As a passenger in a car for an hour without a break: 3 Lying down to rest in the afternoon:  3 Sitting and talking to someone: 0 Sitting quietly after lunch (no alcohol): 1 In a car, while stopped in traffic: 0 Total: 11    Psychiatric/Behavioral: Positive for sleep disturbance.    Objective:  Neurologic Exam  Physical Exam Physical Examination:   Vitals:   11/06/16 1509  BP: (!) 143/96  Pulse: 86  Resp: 20    General Examination: The patient is a very pleasant 40 y.o. male in no acute distress. He appears well-developed and well-nourished and well groomed.   HEENT:  Normocephalic, atraumatic, pupils are equal, round and reactive to light and accommodation. Funduscopic exam is normal with sharp disc margins noted. Extraocular tracking is good without limitation to gaze excursion or nystagmus noted. Normal smooth pursuit is noted. Hearing is grossly intact. Tympanic membranes are clear bilaterally. Face is symmetric with normal facial animation and normal facial sensation. Speech is clear with no dysarthria noted. There is no hypophonia. There is no lip, neck/head, jaw or voice tremor. Neck is supple with full range of passive and active motion. There are no carotid bruits on auscultation. Oropharynx exam reveals: mild mouth dryness, good dental hygiene and moderate airway crowding, due to smaller airway entry and larger uvula. Tonsils absent. Mallampati is class II. Neck circumference is 18-5/8 inches.  Chest: Clear to auscultation without wheezing, rhonchi or crackles noted.  Heart: S1+S2+0, regular and normal without murmurs, rubs or gallops noted.   Abdomen: Soft, non-tender and non-distended with normal bowel sounds appreciated on auscultation.  Extremities: There is no pitting edema in the distal lower extremities bilaterally. Pedal pulses are intact.  Skin: Warm and dry without trophic changes noted. There are no varicose veins.  Musculoskeletal: exam reveals no obvious joint deformities, tenderness or joint swelling or erythema.   Neurologically:  Mental status:  The patient is awake, alert and oriented in all 4 spheres. His immediate and remote memory, attention, language skills and fund of knowledge are appropriate. There is no evidence of aphasia, agnosia, apraxia or anomia. Speech is clear with normal prosody and enunciation. Thought process is linear. Mood is normal and affect is normal.  Cranial nerves II - XII are as described above under HEENT exam. In addition: shoulder shrug is normal with equal shoulder height noted. Motor exam: Normal bulk, strength and tone is noted. There is no drift, tremor or rebound. Romberg is negative. Reflexes are 1+ throughout. Fine motor skills and coordination: intact with normal finger taps, normal hand movements, normal rapid alternating patting, normal foot taps and normal foot agility.  Cerebellar testing: No dysmetria or intention tremor on finger to nose testing. Heel to shin is unremarkable bilaterally. There is no truncal or gait ataxia.  Sensory exam: intact to light touch in the upper and lower extremities.  Gait, station and balance: He stands easily. No veering to one side is noted. No leaning to one side is noted. Posture is age-appropriate and stance is narrow based. Gait shows normal stride length and normal pace. No problems turning are noted.                Assessment and Plan:  In summary, MICKELL BIRDWELL is a very pleasant 40 y.o.-year old male with an underlying medical history of concussion is a preschooler, reflux disease, hiatal hernia, hypertension, history of angina, dizziness, and recurrent headaches, whose history and physical exam are in keeping with obstructive sleep apnea (OSA). He appears to be fully compliant with CPAP therapy, however residual AHI and leak information is not available from the current machine and the download information. He does report daytime somnolence and Epworth Sleepiness Scale score is 11 out of 24 today. I would recommend reevaluation with a split-night sleep study to  optimize treatment settings and hopefully qualify him for a new machine as well. I had a long chat with the patient about my findings and the diagnosis of OSA, its prognosis and treatment options. We talked about medical treatments, surgical interventions and non-pharmacological approaches. I explained in particular the risks and ramifications of untreated moderate to  severe OSA, especially with respect to developing cardiovascular disease down the Road, including congestive heart failure, difficult to treat hypertension, cardiac arrhythmias, or stroke. Even type 2 diabetes has, in part, been linked to untreated OSA. Symptoms of untreated OSA include daytime sleepiness, memory problems, mood irritability and mood disorder such as depression and anxiety, lack of energy, as well as recurrent headaches, especially morning headaches. We talked about trying to maintain a healthy lifestyle in general, as well as the importance of weight control. I encouraged the patient to eat healthy, exercise daily and keep well hydrated, to keep a scheduled bedtime and wake time routine, to not skip any meals and eat healthy snacks in between meals. I advised the patient not to drive when feeling sleepy. I recommended the following at this time: sleep study with potential positive airway pressure titration. (We will score hypopneas at 3% and split the sleep study into diagnostic and treatment portion, if the estimated. 2 hour AHI is >15/h).   I explained the sleep test procedure to the patient and also outlined possible surgical and non-surgical treatment options of OSA, including the use of a custom-made dental device (which would require a referral to a specialist dentist or oral surgeon), upper airway surgical options, such as pillar implants, radiofrequency surgery, tongue base surgery, and UPPP (which would involve a referral to an ENT surgeon). Rarely, jaw surgery such as mandibular advancement may be considered.  I also  explained the CPAP treatment option to the patient, who indicated that he would be willing to continue with CPAP if the need arises. I explained the importance of being compliant with PAP treatment, not only for insurance purposes but primarily to improve His symptoms, and for the patient's long term health benefit, including to reduce His cardiovascular risks. I answered all his questions today and the patient was in agreement. I would like to see him back after the sleep study is completed and encouraged him to call with any interim questions, concerns, problems or updates.   Thank you very much for allowing me to participate in the care of this nice patient. If I can be of any further assistance to you please do not hesitate to talk to me.  Sincerely,   [name] FoleySaima Santiel Topper, MD, PhD

## 2016-11-13 DIAGNOSIS — M5441 Lumbago with sciatica, right side: Secondary | ICD-10-CM | POA: Diagnosis not present

## 2016-11-16 DIAGNOSIS — M5441 Lumbago with sciatica, right side: Secondary | ICD-10-CM | POA: Diagnosis not present

## 2016-11-20 DIAGNOSIS — M5441 Lumbago with sciatica, right side: Secondary | ICD-10-CM | POA: Diagnosis not present

## 2016-11-22 ENCOUNTER — Ambulatory Visit (INDEPENDENT_AMBULATORY_CARE_PROVIDER_SITE_OTHER): Payer: BLUE CROSS/BLUE SHIELD | Admitting: Neurology

## 2016-11-22 DIAGNOSIS — G4733 Obstructive sleep apnea (adult) (pediatric): Secondary | ICD-10-CM

## 2016-11-22 DIAGNOSIS — G4761 Periodic limb movement disorder: Secondary | ICD-10-CM

## 2016-11-23 DIAGNOSIS — M5441 Lumbago with sciatica, right side: Secondary | ICD-10-CM | POA: Diagnosis not present

## 2016-11-24 NOTE — Addendum Note (Signed)
Addended by: Huston FoleyATHAR, Charday Capetillo on: 11/24/2016 01:37 PM   Modules accepted: Orders

## 2016-11-24 NOTE — Progress Notes (Signed)
Diana:  Patient referred by Dr. Marjory LiesPenumalli, seen by me on 11/06/16, split study on 11/22/16. Please call and notify patient that the recent sleep study confirmed the diagnosis of severe OSA. He did very well with CPAP during the study with significant improvement of the respiratory events. Therefore, I would like start the patient on CPAP therapy at home by prescribing a machine for home use. I placed the order in the chart. The patient will need a follow up appointment with me in 8 to 10 weeks post set up that has to be scheduled; please go ahead and schedule while you have the patient on the phone and make sure patient understands the importance of keeping this window for the FU appointment, as it is often an insurance requirement and failing to adhere to this may result in losing coverage for sleep apnea treatment.  Please re-enforce the importance of compliance with treatment and the need for us to monitor compliance data - again an insurance requirement and good feedback for the patient as far as how they are doing.  Also remind patient, that any upcoming CPAP machine or mask issues, should be first addressed with the DME company. Please ask if patient has a preference regarding DME company.  Please arrange for CPAP set up at home through a DME company of patient's choice - once you have spoken to the patient - and faxed/routed report to PCP and referring MD (if other than PCP), you can close this encounter, thanks,   Huston FoleySaima Cordaro Mukai, MD, PhD [facility] ([facility])

## 2016-11-24 NOTE — Procedures (Signed)
PATIENT'S NAME:  [name] Hart, [name] DOB:      1976-07-12      MR#:    [id number]     DATE OF RECORDING: 11/22/2016 REFERRING M.D.:  [name] CampbellKristen W. Kaplan PA-C Study Performed:  Split-Night Titration Study HISTORY:  40 year old man with a history of concussion is a preschooler, reflux disease, hiatal hernia, hypertension, history of angina, dizziness, and recurrent headaches, who was previously diagnosed with obstructive sleep apnea over 10 years ago and placed on CPAP therapy. He presents for re-evaluation due to interim weight gain and is CPAP machine is not working properly. The patient endorsed the Epworth Sleepiness Scale at 11 points. The patient's weight 276 pounds with a height of 64 (inches), resulting in a BMI of 47. kg/m2. The patient's neck circumference measured 18.5 inches.  CURRENT MEDICATIONS: Lipitor, Nexium, Mobic, Lisinopril, Metformin, Aspirin  PROCEDURE:  This is a multichannel digital polysomnogram utilizing the Somnostar 11.2 system.  Electrodes and sensors were applied and monitored per AASM Specifications.   EEG, EOG, Chin and Limb EMG, were sampled at 200 Hz.  ECG, Snore and Nasal Pressure, Thermal Airflow, Respiratory Effort, CPAP Flow and Pressure, Oximetry was sampled at 50 Hz. Digital video and audio were recorded.      BASELINE STUDY WITHOUT CPAP RESULTS:  Lights Out was at 22:53 and Lights On at 05:20 for the night.  Total recording time (TRT) was 147, with a total sleep time (TST) of 128.5 minutes.   The patient's sleep latency was 9 minutes.  REM latency was 119 minutes.  The sleep efficiency was 87.4 %.    SLEEP ARCHITECTURE: WASO (Wake after sleep onset) was 3.5 minutes, Stage N1 was 14 minutes, Stage N2 was 82.5 minutes, Stage N3 was 25 minutes and Stage R (REM sleep) was 7 minutes.  The percentages were Stage N1 10.9%, Stage N2 64.2%, Stage N3 19.5% and Stage R (REM sleep) 5.4%.   The arousals were noted as: 7 were spontaneous, 8 were associated with PLMs, 5 were  associated with respiratory events.   Audio and video analysis did not show any abnormal or unusual movements, behaviors, phonations or vocalizations.  The patient took 2 bathroom breaks. Mild to moderate snoring was noted. The EKG was in keeping with normal sinus rhythm (NSR).   RESPIRATORY ANALYSIS:  There were a total of 66 respiratory events:  18 obstructive apneas, 0 central apneas and 0 mixed apneas with a total of 18 apneas and an apnea index (AI) of 8.4. There were 48 hypopneas with a hypopnea index of 22.4. The patient also had 0 respiratory event related arousals (RERAs).  Snoring was noted.     The total APNEA/HYPOPNEA INDEX (AHI) was 30.8 /hour and the total RESPIRATORY DISTURBANCE INDEX was 30.8 /hour.  15 events occurred in REM sleep and 84 events in NREM. The REM AHI was 128.6, /hour versus a non-REM AHI of 25.2 /hour. The patient spent 342 minutes sleep time in the supine position 0 minutes in non-supine. The supine AHI was 30.8 /hour versus a non-supine AHI of 0.0 /hour.  OXYGEN SATURATION & C02:  The wake baseline 02 saturation was 96%, with the lowest being 83%. Time spent below 89% saturation equaled 13 minutes.  PERIODIC LIMB MOVEMENTS:    The patient had a total of 35 Periodic Limb Movements.  The Periodic Limb Movement (PLM) index was 16.3 /hour and the PLM Arousal index was 3.7 /hour.  TITRATION STUDY WITH CPAP RESULTS:   The patient was fitted with a large  F20 FFM, per his preference, and CPAP was initiated at 5 cmH20 with heated humidity per AASM split night standards and pressure was advanced to 11 cmH20 because of hypopneas, apneas and desaturations.  At a PAP pressure of 11 cmH20, there was a reduction of the AHI to 0 /hour, O2 nadir of 93% and supine REM sleep achieved.   Total recording time (TRT) was 240 minutes, with a total sleep time (TST) of 213.5 minutes. The patient's sleep latency was 11.5 minutes. REM latency was 16.5 minutes.  The sleep efficiency was 89. %.     SLEEP ARCHITECTURE: Wake after sleep was 3.5 minutes, Stage N1 9 minutes, Stage N2 87.5 minutes, Stage N3 41 minutes and Stage R (REM sleep) 76 minutes. The percentages were: Stage N1 4.2%, Stage N2 41.%, Stage N3 19.2% and Stage R (REM sleep) 35.6%.   The arousals were noted as: 13 were spontaneous, 3 were associated with PLMs, 0 were associated with respiratory events.  RESPIRATORY ANALYSIS:  There were a total of 4 respiratory events: 0 obstructive apneas, 0 central apneas and 0 mixed apneas with a total of 0 apneas and an apnea index (AI) of 0. There were 4 hypopneas with a hypopnea index of 1.1 /hour. The patient also had 0 respiratory event related arousals (RERAs).      The total APNEA/HYPOPNEA INDEX  (AHI) was 1.1 /hour and the total RESPIRATORY DISTURBANCE INDEX was 1.1 /hour.  0 events occurred in REM sleep and 4 events in NREM. The REM AHI was 0 /hour versus a non-REM AHI of 1.7 /hour. REM sleep was achieved on a pressure of  cm/h2o (AHI was  .) The patient spent 100% of total sleep time in the supine position. The supine AHI was 1.1 /hour, versus a non-supine AHI of 0.0/hour.  OXYGEN SATURATION & C02:  The wake baseline 02 saturation was 96%, with the lowest being 89%. Time spent below 89% saturation equaled 0 minutes.  PERIODIC LIMB MOVEMENTS:    The patient had a total of 5 Periodic Limb Movements. The Periodic Limb Movement (PLM) index was 1.4 /hour and the PLM Arousal index was .8 /hour.  POLYSOMNOGRAPHY IMPRESSION :   1. Obstructive Sleep Apnea (OSA)  2. Periodic Limb Movement Disorder (PLMD)  RECOMMENDATIONS:  1. This patient has severe obstructive sleep apnea and responded well on CPAP therapy. I will, therefore, start the patient on home CPAP treatment at a pressure of 11 cm via large FFM, with heated humidity. The patient should be reminded to be fully compliant with PAP therapy to improve sleep related symptoms and decrease long term cardiovascular risks. Please note  that untreated obstructive sleep apnea carries additional perioperative morbidity. Patients with significant obstructive sleep apnea should receive perioperative PAP therapy and the surgeons and particularly the anesthesiologist should be informed of the diagnosis and the severity of the sleep disordered breathing. 2. The patient should be cautioned not to drive, work at heights, or operate dangerous or heavy equipment when tired or sleepy. Review and reiteration of good sleep hygiene measures should be pursued with any patient. 3. Mild PLMs (periodic limb movements of sleep) were noted during the baseline portion of the study only and without significant arousals; clinical correlation is recommended.  4. The patient will be seen in follow-up by Dr. Frances Furbish at [facility] for discussion of the test results and further management strategies. The referring provider will be notified of the test results.   I certify that I have reviewed the entire raw  data recording prior to the issuance of this report in accordance with the Standards of Accreditation of the American Academy of Sleep Medicine (AASM)       Huston FoleySaima Congetta Odriscoll, MD, PhD Diplomat, American Board of Psychiatry and Neurology (Neurology and Sleep Medicine)

## 2016-11-28 DIAGNOSIS — M5441 Lumbago with sciatica, right side: Secondary | ICD-10-CM | POA: Diagnosis not present

## 2016-11-29 ENCOUNTER — Institutional Professional Consult (permissible substitution): Payer: BLUE CROSS/BLUE SHIELD | Admitting: Neurology

## 2016-11-30 ENCOUNTER — Telehealth: Payer: Self-pay

## 2016-11-30 NOTE — Telephone Encounter (Signed)
I spoke to pt. I advised him that his sleep study revealed severe osa but that he did well with cpap therapy and Dr. Frances FurbishAthar would like pt to start cpap at home. Pt says that he already has a cpap at home, it is 423.38 years old, set at 5614 cmwp. Pt was using [facility] bu would like to switch to another DME. Will send to [facility]. Pt is agreeable to this. I reviewed cpap compliance with pt. A follow up appt was scheduled for 02/26/2017 with Dr. Frances FurbishAthar. Pt verbalized understanding of results. Pt had no questions at this time but was encouraged to call back if questions arise.

## 2016-11-30 NOTE — Telephone Encounter (Signed)
-----   Message from Huston FoleySaima Athar, MD sent at 11/24/2016  1:37 PM EST ----- [name] Hart:  Patient referred by Dr. Marjory LiesPenumalli, seen by me on 11/06/16, split study on 11/22/16. Please call and notify patient that the recent sleep study confirmed the diagnosis of severe OSA. He did very well with CPAP during the study with significant improvement of the respiratory events. Therefore, I would like start the patient on CPAP therapy at home by prescribing a machine for home use. I placed the order in the chart. The patient will need a follow up appointment with me in 8 to 10 weeks post set up that has to be scheduled; please go ahead and schedule while you have the patient on the phone and make sure patient understands the importance of keeping this window for the FU appointment, as it is often an insurance requirement and failing to adhere to this may result in losing coverage for sleep apnea treatment.  Please re-enforce the importance of compliance with treatment and the need for us to monitor compliance data - again an insurance requirement and good feedback for the patient as far as how they are doing.  Also remind patient, that any upcoming CPAP machine or mask issues, should be first addressed with the DME company. Please ask if patient has a preference regarding DME company.  Please arrange for CPAP set up at home through a DME company of patient's choice - once you have spoken to the patient - and faxed/routed report to PCP and referring MD (if other than PCP), you can close this encounter, thanks,   Huston FoleySaima Athar, MD, PhD [facility] ([facility])

## 2016-12-01 DIAGNOSIS — M5441 Lumbago with sciatica, right side: Secondary | ICD-10-CM | POA: Diagnosis not present

## 2016-12-08 DIAGNOSIS — E7800 Pure hypercholesterolemia, unspecified: Secondary | ICD-10-CM | POA: Diagnosis not present

## 2016-12-08 DIAGNOSIS — I10 Essential (primary) hypertension: Secondary | ICD-10-CM | POA: Diagnosis not present

## 2016-12-11 DIAGNOSIS — Z0000 Encounter for general adult medical examination without abnormal findings: Secondary | ICD-10-CM | POA: Diagnosis not present

## 2016-12-11 DIAGNOSIS — R739 Hyperglycemia, unspecified: Secondary | ICD-10-CM | POA: Diagnosis not present

## 2016-12-13 DIAGNOSIS — M5441 Lumbago with sciatica, right side: Secondary | ICD-10-CM | POA: Diagnosis not present

## 2016-12-13 DIAGNOSIS — Z0000 Encounter for general adult medical examination without abnormal findings: Secondary | ICD-10-CM | POA: Diagnosis not present

## 2016-12-13 DIAGNOSIS — M25511 Pain in right shoulder: Secondary | ICD-10-CM | POA: Diagnosis not present

## 2016-12-13 DIAGNOSIS — G8929 Other chronic pain: Secondary | ICD-10-CM | POA: Diagnosis not present

## 2016-12-15 DIAGNOSIS — G4733 Obstructive sleep apnea (adult) (pediatric): Secondary | ICD-10-CM | POA: Diagnosis not present

## 2017-01-29 DIAGNOSIS — M25512 Pain in left shoulder: Secondary | ICD-10-CM | POA: Diagnosis not present

## 2017-01-29 DIAGNOSIS — R0789 Other chest pain: Secondary | ICD-10-CM | POA: Diagnosis not present

## 2017-01-30 DIAGNOSIS — M5441 Lumbago with sciatica, right side: Secondary | ICD-10-CM | POA: Diagnosis not present

## 2017-01-31 DIAGNOSIS — R935 Abnormal findings on diagnostic imaging of other abdominal regions, including retroperitoneum: Secondary | ICD-10-CM | POA: Diagnosis not present

## 2017-01-31 DIAGNOSIS — K5909 Other constipation: Secondary | ICD-10-CM | POA: Diagnosis not present

## 2017-01-31 DIAGNOSIS — R1032 Left lower quadrant pain: Secondary | ICD-10-CM | POA: Diagnosis not present

## 2017-02-05 ENCOUNTER — Encounter (INDEPENDENT_AMBULATORY_CARE_PROVIDER_SITE_OTHER): Payer: Self-pay

## 2017-02-05 ENCOUNTER — Ambulatory Visit (INDEPENDENT_AMBULATORY_CARE_PROVIDER_SITE_OTHER): Payer: BLUE CROSS/BLUE SHIELD | Admitting: Diagnostic Neuroimaging

## 2017-02-05 ENCOUNTER — Other Ambulatory Visit: Payer: Self-pay | Admitting: Physician Assistant

## 2017-02-05 ENCOUNTER — Other Ambulatory Visit: Payer: Self-pay

## 2017-02-05 ENCOUNTER — Encounter: Payer: Self-pay | Admitting: Diagnostic Neuroimaging

## 2017-02-05 ENCOUNTER — Other Ambulatory Visit: Payer: BLUE CROSS/BLUE SHIELD

## 2017-02-05 DIAGNOSIS — Z9989 Dependence on other enabling machines and devices: Secondary | ICD-10-CM | POA: Insufficient documentation

## 2017-02-05 DIAGNOSIS — G4733 Obstructive sleep apnea (adult) (pediatric): Secondary | ICD-10-CM

## 2017-02-05 DIAGNOSIS — R1032 Left lower quadrant pain: Secondary | ICD-10-CM

## 2017-02-05 DIAGNOSIS — R200 Anesthesia of skin: Secondary | ICD-10-CM | POA: Diagnosis not present

## 2017-02-05 DIAGNOSIS — Z9189 Other specified personal risk factors, not elsewhere classified: Secondary | ICD-10-CM | POA: Diagnosis not present

## 2017-02-05 DIAGNOSIS — Z789 Other specified health status: Secondary | ICD-10-CM | POA: Insufficient documentation

## 2017-02-05 NOTE — Progress Notes (Signed)
[facility]  PATIENT: [name] Hart DOB: [birth year]  REFERRING CLINICIAN: Terri PiedraK Kaplan, NP HISTORY FROM: patient  REASON FOR VISIT: follow up    HISTORICAL  CHIEF COMPLAINT:  Chief Complaint  Patient presents with  . Numbness and tingling    rm 7, "numbness, tingling comes and goes- both arms/legs"  . Follow-up    3 month    HISTORY OF PRESENT ILLNESS:   UPDATE 02/05/17: Since last visit, symptoms continuing. Still with pain and numbness in arms and legs. Struggling with stress, weight, inactivity. Trying to improve with exercises.   NEW HPI (10/31/16): 41 year old male with intermittent numbness. 2-3 months ago, having more right low back pain, spasm, with pain radiating to the right hip and thigh. Worse with standing and walking. MRI lumbar spine was overall unremarkable. Also with intermittent bilateral hand, arm, neck and back numbness. A1c slightly higher at 6.2. Also with OSA, on CPAP (pressure 4cm H2O) since 2006, but he thinks he needs a new machine.   UPDATE 10/21/12: Doing about the same. Dizziness is still random. Mainly at work. Some intermittent migraine HA (1-2 days per month). Some right sided eye blurred vision and fatigue. Going to turn in CPAP auto-titration tomorrow.  UPDATE 07/31/12: NEW PROBLEM: 41 year old male with history of sleep apnea, hypercholesterolemia, borderline diabetes, obesity, here for evaluation of new onset dizziness and headache for past 2 months. Patient describes intermittent lightheadedness, room spinning, side to side movement sensation, sometimes with blurred vision. Episodes occurred when playing video games, working on the computer or talking with someone. Physical activity and exertion did not seem to aggravate the problem. Position motion do not seem to aggravate the problem. He has had some mild tension headache, mild nausea with these episodes. He has prior history of intermittent migraine headaches. Other aggravating  factors include new addition of drinking half a pot coffee per day around the time of onset of these new symptoms.  UPDATE 01/17/12: Sxs resolved, but now worsened since heavier work stress and hrs. More time on computer.   UPDATE 08/23/11: Still with intermittent left facial numbness, ususally with stress.  Labs, MRI, tesing reviewed.  PRIOR HPI: [age] year old right-handed male with history of sleep apnea, acid reflux, hiatal hernia, left cholesteatoma status post resection, here for evaluation of left face and left arm numbness since February 2011. Patient had episode of numbness in the left face, neck and left posterior arm in Feb 2011.  Symptoms lasted for several weeks and then subsided. Approximately April 2012 he had a flare up of this similar symptom, with pain and tightness in his left posterior neck and shoulder, numbness in his left face and left arm. He has had intermittent symptoms lasting for hours at a time. He feels a "knot" in his left cervical paraspinal region. He denies right-sided numbness.  He denies weakness, slurred speech, balance or coordination problems.  Sxs seem to worsen with physical activity.   REVIEW OF SYSTEMS: Full 14 system review of systems performed and negative with exception of: env allergies hearing loss constipation aching muscles agitation shortness of breath.   ALLERGIES: Allergies  Allergen Reactions  . Clindamycin Itching and Rash  . Pantoprazole Rash    HOME MEDICATIONS: Outpatient Medications Prior to Visit  Medication Sig Dispense Refill  . aspirin EC 81 MG tablet Take 81 mg by mouth daily.      [name]. atorvastatin (LIPITOR) 10 MG tablet Take 10 mg by mouth daily.      .[name]  COMBIGAN 0.2-0.5 % ophthalmic solution ONE DROP IN EACH EYE TWICE DAILY  5  . esomeprazole (NEXIUM) 40 MG capsule Take 40 mg by mouth daily before breakfast.      . lisinopril (PRINIVIL,ZESTRIL) 5 MG tablet 5 mg daily.    . metFORMIN (GLUCOPHAGE) 500 MG tablet 500 mg daily.    .  Omega-3 Fatty Acids (FISH OIL) 1000 MG CAPS Take 1,000 mg by mouth daily.      . meloxicam (MOBIC) 15 MG tablet Take 15 mg by mouth.     No facility-administered medications prior to visit.     PAST MEDICAL HISTORY: Past Medical History:  Diagnosis Date  . Angina    pain in back lft arm aug 12 echo done in epic  neg  . Concussion    41 yrs old  . GERD (gastroesophageal reflux disease)   . Hiatal hernia    sliding hernia  . Hypertension   . PONV (postoperative nausea and vomiting)   . Shortness of breath   . Sleep apnea    cpap used  since 06    PAST SURGICAL HISTORY: Past Surgical History:  Procedure Laterality Date  . CHOLECYSTECTOMY  09  . CHOLESTEATOMA EXCISION  02   x2  lft parotid area  . NASAL SEPTOPLASTY W/ TURBINOPLASTY  11/02/2011   Procedure: NASAL SEPTOPLASTY WITH TURBINATE REDUCTION;  Surgeon: Leonette Most;  Location: [facility] OR;  Service: ENT;  Laterality: Bilateral;  SEPTOPLASTY WITH BILATERAL TURBINATE REDUCTION  . TONSILLECTOMY      FAMILY HISTORY: Family History  Problem Relation Age of Onset  . Hypertension Father   . Diabetes Father     SOCIAL HISTORY:  Social History   Social History  . Marital status: Married    Spouse name: April  . Number of children: 2  . Years of education: 26   Occupational History  .      [employer]   Social History Main Topics  . Smoking status: Never Smoker  . Smokeless tobacco: Never Used  . Alcohol use Yes     Comment: light/occas  . Drug use: No  . Sexual activity: Yes   Other Topics Concern  . Not on file   Social History Narrative   Lives with family   Caffeine - coffee, 2 20 oz cups daily; sodas, 3+ daily     PHYSICAL EXAM  GENERAL EXAM/CONSTITUTIONAL: Vitals:  Vitals:   02/05/17 1406  BP: (!) 139/93  Pulse: 83  Weight: 276 lb 9.6 oz (125.5 kg)   Wt Readings from Last 3 Encounters:  02/05/17 276 lb 9.6 oz (125.5 kg)  11/06/16 276 lb (125.2 kg)  10/31/16 279 lb (126.6 kg)   Body mass index  is 47.48 kg/m. No exam data present  Patient is in no distress; well developed, nourished and groomed; neck is supple  CARDIOVASCULAR:  Examination of carotid arteries is normal; no carotid bruits  Regular rate and rhythm, no murmurs  Examination of peripheral vascular system by observation and palpation is normal  EYES:  Ophthalmoscopic exam of optic discs and posterior segments is normal; no papilledema or hemorrhages  MUSCULOSKELETAL:  Gait, strength, tone, movements noted in Neurologic exam below  NEUROLOGIC: MENTAL STATUS:  No flowsheet data found.  awake, alert, oriented to person, place and time  recent and remote memory intact  normal attention and concentration  language fluent, comprehension intact, naming intact,   fund of knowledge appropriate  CRANIAL NERVE:   2nd - no papilledema on  fundoscopic exam  2nd, 3rd, 4th, 6th - pupils equal and reactive to light, visual fields full to confrontation, extraocular muscles intact, no nystagmus  5th - facial sensation symmetric  7th - facial strength symmetric  8th - hearing intact  9th - palate elevates symmetrically, uvula midline  11th - shoulder shrug symmetric  12th - tongue protrusion midline  MOTOR:   normal bulk and tone, full strength in the BUE, BLE  SENSORY:   normal and symmetric to light touch, temperature, vibration  COORDINATION:   finger-nose-finger, fine finger movements normal  REFLEXES:   deep tendon reflexes present and symmetric  GAIT/STATION:   narrow based gait; able to walk on toes, heels and tandem; romberg is negative     DIAGNOSTIC DATA (LABS, IMAGING, TESTING) - I reviewed patient records, labs, notes, testing and imaging myself where available.  Lab Results  Component Value Date   WBC 8.5 10/30/2011   HGB 14.3 10/30/2011   HCT 40.6 10/30/2011   MCV 85.7 10/30/2011   PLT 294 10/30/2011      Component Value Date/Time   NA 140 10/30/2011 1600   K  4.6 10/30/2011 1600   CL 101 10/30/2011 1600   CO2 27 10/30/2011 1600   GLUCOSE 89 10/30/2011 1600   BUN 12 10/30/2011 1600   CREATININE 0.87 10/30/2011 1600   CALCIUM 10.1 10/30/2011 1600   GFRNONAA >90 10/30/2011 1600   GFRAA >90 10/30/2011 1600   No results found for: CHOL, HDL, LDLCALC, LDLDIRECT, TRIG, CHOLHDL No results found for: ZOXW9U No results found for: VITAMINB12 No results found for: TSH   07/26/11 MRI brain  - normal  07/26/11 MRA head  - normal  07/26/11 MRI cervical  - Equivocal MRI cervical spine (without contrast) demonstrating mild disc bulging at C5-6 and C6-7.    08/09/11 carotid u/s - negative for ICA stenosis  10/28/16 MRI lumbar spine  - Mild L5-S1 disc degeneration without spinal canal or neural foraminal stenosis.     ASSESSMENT AND PLAN  41 y.o. year old male here with severe obesity, borderline diabetes, sleep apnea, here for evaluation of intermittent numbness and tingling.   Ddx: diabetic neuropathy, obesity neuropathy, lumbar radiculopathy  1. Morbid obesity (HCC)   2. Excessive caffeine intake   3. OSA on CPAP   4. Numbness     PLAN: - advised on strategies for nutrition, physical activity, stress mgmt - will refer to medical weight loss mgmt clinic  Orders Placed This Encounter  Procedures  . Ambulatory referral to [facility]   Return if symptoms worsen or fail to improve, for return to PCP.    Suanne Marker, MD 02/05/2017, 2:32 PM Certified in Neurology, Neurophysiology and Neuroimaging  [facility] [address] [place], [place] [address] [phone]

## 2017-02-05 NOTE — Patient Instructions (Signed)
Thank you for coming to see [name] at [facility]. I hope we have been able to provide you high quality care today.  You may receive a patient satisfaction survey over the next few weeks. We would appreciate your feedback and comments so that we may continue to improve ourselves and the health of our patients.   - continue to improve nutrition and physical activity   ~~~~~~~~~~~~~~~~~~~~~~~~~~~~~~~~~~~~~~~~~~~~~~~~~~~~~~~~~~~~~~~~~  DR. PENUMALLI'S GUIDE TO HAPPY AND HEALTHY LIVING These are some of my general health and wellness recommendations. Some of them may apply to you better than others. Please use common sense as you try these suggestions and feel free to ask me any questions.   ACTIVITY/FITNESS Mental, social, emotional and physical stimulation are very important for brain and body health. Try learning a new activity (arts, music, language, sports, games).  Keep moving your body to the best of your abilities. You can do this at home, inside or outside, the park, community center, gym or anywhere you like. Consider a physical therapist or personal trainer to get started. Consider the app Sworkit. Fitness trackers such as smart-watches, smart-phones or Fitbits can help as well.   NUTRITION Eat more plants: colorful vegetables, nuts, seeds and berries.  Eat less sugar, salt, preservatives and processed foods.  Avoid toxins such as cigarettes and alcohol.  Drink water when you are thirsty. Warm water with a slice of lemon is an excellent morning drink to start the day.  Consider these websites for more information The Nutrition Source (https://www.henry-hernandez.biz/) [employer] Nutrition (WindowBlog.ch)   RELAXATION Consider practicing mindfulness meditation or other relaxation techniques such as deep breathing, prayer, yoga, tai chi, massage. See website mindful.org or the apps Headspace or Calm to help get  started.   SLEEP Try to get at least 7-8+ hours sleep per day. Regular exercise and reduced caffeine will help you sleep better. Practice good sleep hygeine techniques. See website sleep.org for more information.   PLANNING Prepare estate planning, living will, healthcare POA documents. Sometimes this is best planned with the help of an attorney. Theconversationproject.org and agingwithdignity.org are excellent resources.

## 2017-02-06 ENCOUNTER — Ambulatory Visit
Admission: RE | Admit: 2017-02-06 | Discharge: 2017-02-06 | Disposition: A | Discharge status: Home / Self Care | Payer: BLUE CROSS/BLUE SHIELD | Source: Ambulatory Visit | Attending: Physician Assistant | Admitting: Physician Assistant

## 2017-02-06 DIAGNOSIS — R1032 Left lower quadrant pain: Secondary | ICD-10-CM | POA: Diagnosis not present

## 2017-02-08 DIAGNOSIS — M5441 Lumbago with sciatica, right side: Secondary | ICD-10-CM | POA: Diagnosis not present

## 2017-02-12 DIAGNOSIS — K5900 Constipation, unspecified: Secondary | ICD-10-CM | POA: Diagnosis not present

## 2017-02-12 DIAGNOSIS — R109 Unspecified abdominal pain: Secondary | ICD-10-CM | POA: Diagnosis not present

## 2017-02-13 DIAGNOSIS — M5441 Lumbago with sciatica, right side: Secondary | ICD-10-CM | POA: Diagnosis not present

## 2017-02-22 ENCOUNTER — Telehealth: Payer: Self-pay

## 2017-02-22 ENCOUNTER — Encounter (INDEPENDENT_AMBULATORY_CARE_PROVIDER_SITE_OTHER): Payer: BLUE CROSS/BLUE SHIELD | Admitting: Family Medicine

## 2017-02-22 NOTE — Telephone Encounter (Signed)
LM for patient to bring in CPAP machine or SD card to appt on Monday.

## 2017-02-26 ENCOUNTER — Ambulatory Visit (INDEPENDENT_AMBULATORY_CARE_PROVIDER_SITE_OTHER): Payer: BLUE CROSS/BLUE SHIELD | Admitting: Neurology

## 2017-02-26 ENCOUNTER — Encounter: Payer: Self-pay | Admitting: Neurology

## 2017-02-26 VITALS — BP 136/80 | HR 82 | Resp 18 | Ht 64.0 in | Wt 275.0 lb

## 2017-02-26 DIAGNOSIS — M5441 Lumbago with sciatica, right side: Secondary | ICD-10-CM | POA: Diagnosis not present

## 2017-02-26 DIAGNOSIS — G4733 Obstructive sleep apnea (adult) (pediatric): Secondary | ICD-10-CM

## 2017-02-26 DIAGNOSIS — Z9989 Dependence on other enabling machines and devices: Secondary | ICD-10-CM

## 2017-02-26 NOTE — Progress Notes (Signed)
Subjective:    Patient ID: [name] Hart is a 41 y.o. male.  HPI     Interim history:   [name] Hart is a 41 year old right-handed gentleman with an underlying medical history of concussion is a preschooler, reflux disease, hiatal hernia, hypertension, history of angina, dizziness, and recurrent headaches, who presents for follow-up consultation of his obstructive sleep apnea, after her recent split-night sleep study. I first met him on 11/06/2016 at the request of Dr. Leta Hart, , at which time he reported a prior diagnosis of OSA over 10 years prior and he was on CPAP therapy. He reported compliance with CPAP but also had interim weight gain, needed a new machine as the humidifier was defective. I invited him for sleep study. He had a split-night sleep study and 11/22/2016. I went over his test results with him in detail today. Sleep latency was 9 minutes at baseline, REM latency high-normal at 119 minutes, sleep efficiency 87.4%, he had an increased percentage of light stage sleep, slow-wave sleep was 19.5% and REM sleep was 5.4%. Total AHI was 30.8 per hour, REM AHI 128.6 per hour. Average oxygen saturation was 96%, nadir was 83%. He had mild PLMS with an index of 16.3 per hour, minimal arousals. He was then titrated with CPAP starting at 5 cm and advanced to 11 cm. AHI was 0 per hour on the final pressure with O2 nadir of 93% and supine REM sleep achieved. Average oxygen saturation was 96%, nadir was 89%, he had no significant PLMS during the second part of the study. Based on this test results I prescribed CPAP at 11 cm and a new machine and supplies.  Today, 02/26/2017 (all dictated new, as well as above notes, some dictation done in note pad or Word, outside of chart, may appear as copied):  I reviewed his CPAP compliance data from 10/07/2016 through 11/05/2016, which is a total of 30 days, during which time he used his machine every night with percent used days greater than 4 hours at 100%,  indicating superb compliance with an average usage of 8 hours and 8 minutes, residual AHI information not available but CPAP was his old pressure of 14 cm rather than 11. He reports he has been using old CPAP with the new settings. He was not able to get a new CPAP machine. He had met his insurance deductible by the end of the year but by the time his new CPAP order was process, it was after the first of the year and he could not get a new machine. He still does not use his humidifier. The pressure at 11 cm is better tolerable to him, he still has dry mouth, he tried a nasal mask with a chinstrap but could not tolerate the chin strap. He went back to using his fullface mask. We will try to get more recent compliance data and he has a compliance card in his machine but he did not bring it today.   Previously (copied from previous notes for reference):   11/06/2016: He was previously diagnosed with obstructive sleep apnea over 10 years ago and placed on CPAP therapy. Prior sleep study results are not available for my review. I reviewed his CPAP compliance data is not available for my review today. I reviewed your office note from 10/31/2016. He reports full compliance with CPAP therapy but has had interim weight gain. He also most likely needs an updated machine, humidifier is not working. Epworth sleepiness score is 11 out of 24,  fatigue score is 38 out of 63 today. He drinks quite a bit of caffeine, 2 20 oz coffee in AM, about 4 cans of [name] Hart and decaff tea in the evening.  In the past couple of years had weight gain, about 14 lb.  Original sleep study was in 2002 or 2003, had CPAP at 10 cm then, moved to [place] and had pressure increase and a new machine around 2006. Last sleep study in [place] in 2007 or so, pressure was increased to 14 cm. Current machine is about 42 1/39 years old. He has been using a fullface mask, he has had problems with mouth opening at night. Unfortunately, his humidifier chamber is  defective and he has not been able to use the humidity in the past 8 months and does wake up with significant dry mouth. He goes to bed between 10:30 and 11 and wake up time is between 6 and 6:30. He is an Chief Financial Officer. He lives at home with his wife and 2 children, 2 boys, ages 82 and 2. He is a never smoker but was exposed to secondhand smoke through both parents he states. He drinks alcohol occasionally but does strength quite a bit of sodas and coffee and also some caffeinated and decaf tea. He denies frank restless leg symptoms. He has nocturia about once per night, sometimes twice per night, and has occasional morning headaches. He does not recall a family history of OSA.   His Past Medical History Is Significant For: Past Medical History:  Diagnosis Date  . Angina    pain in back lft arm aug 12 echo done in epic  neg  . Concussion    41 yrs old  . GERD (gastroesophageal reflux disease)   . Hiatal hernia    sliding hernia  . Hypertension   . PONV (postoperative nausea and vomiting)   . Shortness of breath   . Sleep apnea    cpap used  since 06    His Past Surgical History Is Significant For: Past Surgical History:  Procedure Laterality Date  . CHOLECYSTECTOMY  09  . CHOLESTEATOMA EXCISION  02   x2  lft parotid area  . NASAL SEPTOPLASTY W/ TURBINOPLASTY  11/02/2011   Procedure: NASAL SEPTOPLASTY WITH TURBINATE REDUCTION;  Surgeon: [name] Hart;  Location: [facility] OR;  Service: ENT;  Laterality: Bilateral;  SEPTOPLASTY WITH BILATERAL TURBINATE REDUCTION  . TONSILLECTOMY      His Family History Is Significant For: Family History  Problem Relation Age of Onset  . Hypertension Father   . Diabetes Father     His Social History Is Significant For: Social History   Social History  . Marital status: Married    Spouse name: [name] Hart  . Number of children: 2  . Years of education: 69   Occupational History  .      [employer]   Social History Main Topics  . Smoking status: Never Smoker  .  Smokeless tobacco: Never Used  . Alcohol use Yes     Comment: light/occas  . Drug use: No  . Sexual activity: Yes   Other Topics Concern  . None   Social History Narrative   Lives with family   Caffeine - coffee, 2 20 oz cups daily; sodas, 3+ daily    His Allergies Are:  Allergies  Allergen Reactions  . Clindamycin Itching and Rash  . Pantoprazole Rash  :   His Current Medications Are:  Outpatient Encounter Prescriptions as of 02/26/2017  Medication  Sig  . aspirin EC 81 MG tablet Take 81 mg by mouth daily.    [name] atorvastatin (LIPITOR) 10 MG tablet Take 10 mg by mouth daily.    . COMBIGAN 0.2-0.5 % ophthalmic solution ONE DROP IN [facility] TWICE DAILY  . esomeprazole (NEXIUM) 40 MG capsule Take 40 mg by mouth daily before breakfast.    . fexofenadine (ALLEGRA) 180 MG tablet Take 180 mg by mouth as needed.  [name] lisinopril (PRINIVIL,ZESTRIL) 5 MG tablet 5 mg daily.  . metFORMIN (GLUCOPHAGE) 500 MG tablet 500 mg daily.  . Omega-3 Fatty Acids (FISH OIL) 1000 MG CAPS Take 1,000 mg by mouth daily.    . vitamin B-12 (CYANOCOBALAMIN) 100 MCG tablet Take 100 mcg by mouth daily. Unknown dose   No facility-administered encounter medications on file as of 02/26/2017.   :  Review of Systems:  Out of a complete 14 point review of systems, all are reviewed and negative with the exception of these symptoms as listed below:  Review of Systems  Neurological:       Patient states that he is doing well with CPAP. Feels that the pressure is better than before.      Objective:  Neurologic Exam  Physical Exam Physical Examination:   Vitals:   02/26/17 1441  BP: 136/80  Pulse: 82  Resp: 18    General Examination: The patient is a very pleasant 41 y.o. male in no acute distress. He appears well-developed and well-nourished and well groomed.   HEENT: Normocephalic, atraumatic, pupils are equal, round and reactive to light and accommodation. He is mildly sensitive to light. Extraocular  tracking is good without limitation to gaze excursion or nystagmus noted. Normal smooth pursuit is noted. Hearing is grossly intact. Face is symmetric with normal facial animation and normal facial sensation. Speech is clear with no dysarthria noted. There is no hypophonia. There is no lip, neck/head, jaw or voice tremor. Neck is supple with full range of passive and active motion. There are no carotid bruits on auscultation. Oropharynx exam reveals: mild mouth dryness, good dental hygiene and moderate airway crowding. Mallampati is class II. Tongue protrudes centrally and palate elevates symmetrically. Tonsils are absent.   Chest: Clear to auscultation without wheezing, rhonchi or crackles noted.  Heart: S1+S2+0, regular and normal without murmurs, rubs or gallops noted.   Abdomen: Soft, non-tender and non-distended with normal bowel sounds appreciated on auscultation.  Extremities: There is no pitting edema in the distal lower extremities bilaterally. Pedal pulses are intact.  Skin: Warm and dry without trophic changes noted, dry skin lesion L hand.  Musculoskeletal: exam reveals no obvious joint deformities, tenderness or joint swelling or erythema.   Neurologically:  Mental status: The patient is awake, alert and oriented in all 4 spheres. His immediate and remote memory, attention, language skills and fund of knowledge are appropriate. There is no evidence of aphasia, agnosia, apraxia or anomia. Speech is clear with normal prosody and enunciation. Thought process is linear. Mood is normal and affect is normal.  Cranial nerves II - XII are as described above under HEENT exam. In addition: shoulder shrug is normal with equal shoulder height noted. Motor exam: Normal bulk, strength and tone is noted. There is no drift, tremor or rebound. Romberg is negative. Reflexes are 1+ throughout. Fine motor skills and coordination: intact.  Cerebellar testing: No dysmetria or intention tremor. There is no  truncal or gait ataxia.  Sensory exam: intact to light touch in the upper and lower  extremities.  Gait, station and balance: He stands with no significant difficulty. Gait is normal, no problems turning, tandem walk is unremarkable.               Assessment and plan:  In summary, JIA MOHAMED is a very pleasant 41 y.o.-year old male with an underlying medical history of concussion is a preschooler, reflux disease, hiatal hernia, hypertension, history of angina, dizziness, and recurrent headaches, who returns for follow-up consultation of his OSA. He had a split-night sleep study in December 2017 which confirmed severe obstructive sleep apnea and good results with CPAP of 11 cm via full facemask. He indicates compliance with treatment. I would like to see his most recent compliance data since we changed his settings. He is using his old machine currently for financial reasons. He is advised to continue to become fully compliant with treatment and make enough time for sleep as he indicates an average of 6 hours of sleep time currently. Furthermore, he is advised to work on weight loss. He has been referred to weight management and has his first appointment coming up next month. I suggested a six-month follow-up with one of our nurse practitioners. In the interim, I did ask him to drop his compliance data card off to his DME company and we will try to get a recent download from them. I will make an addendum to this note at the time. His exam is stable, weight has been stable but he is working on weight loss. We talked about the importance of staying compliant with CPAP therapy. I answered all his questions today and he was in agreement.  I spent 25 minutes in total face-to-face time with the patient, more than 50% of which was spent in counseling and coordination of care, reviewing test results, reviewing medication and discussing or reviewing the diagnosis of OSA, its prognosis and treatment options.  Pertinent laboratory and imaging test results that were available during this visit with the patient were reviewed by me and considered in my medical decision making (see chart for details).

## 2017-02-26 NOTE — Patient Instructions (Addendum)
Please continue using your CPAP regularly. While your insurance requires that you use CPAP at least 4 hours each night on 70% of the nights, I recommend, that you not skip any nights and use it throughout the night if you can. Getting used to CPAP and staying with the treatment long term does take time and patience and discipline. Untreated obstructive sleep apnea when it is moderate to severe can have an adverse impact on cardiovascular health and raise her risk for heart disease, arrhythmias, hypertension, congestive heart failure, stroke and diabetes. Untreated obstructive sleep apnea causes sleep disruption, nonrestorative sleep, and sleep deprivation. This can have an impact on your day to day functioning and cause daytime sleepiness and impairment of cognitive function, memory loss, mood disturbance, and problems focussing. Using CPAP regularly can improve these symptoms.  We can see you in 6 months, you can see one of our nurse practitioners as you are stable. I will see you after that.

## 2017-02-27 DIAGNOSIS — R110 Nausea: Secondary | ICD-10-CM | POA: Diagnosis not present

## 2017-03-05 DIAGNOSIS — M5441 Lumbago with sciatica, right side: Secondary | ICD-10-CM | POA: Diagnosis not present

## 2017-03-08 DIAGNOSIS — M5441 Lumbago with sciatica, right side: Secondary | ICD-10-CM | POA: Diagnosis not present

## 2017-03-14 DIAGNOSIS — M5441 Lumbago with sciatica, right side: Secondary | ICD-10-CM | POA: Diagnosis not present

## 2017-03-21 ENCOUNTER — Encounter (INDEPENDENT_AMBULATORY_CARE_PROVIDER_SITE_OTHER): Payer: Self-pay | Admitting: Family Medicine

## 2017-03-21 ENCOUNTER — Ambulatory Visit (INDEPENDENT_AMBULATORY_CARE_PROVIDER_SITE_OTHER): Payer: BLUE CROSS/BLUE SHIELD | Admitting: Family Medicine

## 2017-03-21 VITALS — BP 118/79 | Temp 98.8°F | Ht 64.0 in | Wt 272.0 lb

## 2017-03-21 DIAGNOSIS — Z1331 Encounter for screening for depression: Secondary | ICD-10-CM

## 2017-03-21 DIAGNOSIS — Z1389 Encounter for screening for other disorder: Secondary | ICD-10-CM

## 2017-03-21 DIAGNOSIS — Z9189 Other specified personal risk factors, not elsewhere classified: Secondary | ICD-10-CM

## 2017-03-21 DIAGNOSIS — R0602 Shortness of breath: Secondary | ICD-10-CM

## 2017-03-21 DIAGNOSIS — R5383 Other fatigue: Secondary | ICD-10-CM | POA: Diagnosis not present

## 2017-03-21 DIAGNOSIS — I10 Essential (primary) hypertension: Secondary | ICD-10-CM | POA: Diagnosis not present

## 2017-03-21 DIAGNOSIS — E784 Other hyperlipidemia: Secondary | ICD-10-CM | POA: Diagnosis not present

## 2017-03-21 DIAGNOSIS — R7303 Prediabetes: Secondary | ICD-10-CM

## 2017-03-21 DIAGNOSIS — E7849 Other hyperlipidemia: Secondary | ICD-10-CM

## 2017-03-21 DIAGNOSIS — Z0289 Encounter for other administrative examinations: Secondary | ICD-10-CM

## 2017-03-21 NOTE — Progress Notes (Signed)
Office: [phone]  /  Fax: [phone]   HPI:   Chief Complaint: OBESITY  [name] Hart (MR# [id number]) is a 41 y.o. male who presents on 03/21/2017 for obesity evaluation and treatment. Current BMI is Body mass index is 46.69 kg/m.[name] Hart [name] Hart has struggled with obesity for years and has been unsuccessful in either losing weight or maintaining long term weight loss. Ray attended our information session and states he is currently in the action stage of change and ready to dedicate time achieving and maintaining a healthier weight.  [name] Hart states his family eats meals together he thinks his family will eat healthier with  him he struggles with family and or coworkers weight loss sabotage his desired weight loss is 125 he has been heavy most of  his life, since sophomore year in college he started gaining weight in 8th grade and again Junior year in college his heaviest weight ever was 270 lbs. he has significant food cravings issues  he snacks frequently in the evenings he skips meals frequently he is frequently drinking liquids with calories he frequently makes poor food choices he has problems with excessive hunger  he frequently eats larger portions than normal  he has binge eating behaviors he struggles with emotional eating    Fatigue Estevan feels his energy is lower than it should be. This has worsened with weight gain and has not worsened recently. Augusto admits to daytime somnolence and  admits to waking up still tired. Patient is at risk for obstructive sleep apnea. Patent has a history of symptoms of daytime fatigue, morning headache and hypertension. Patient generally gets 7 or 8 hours of sleep per night, and states they generally have generally restful sleep. Snoring is not present with CPAP machine usually. Apneic episodes are present without his CPAP. Epworth Sleepiness Score is 9  Dyspnea on exertion [name] Hart notes increasing shortness of breath with exercising  and seems to be worsening over time with weight gain. He notes getting out of breath sooner with activity than he used to. This has not gotten worse recently. [name] Hart denies orthopnea.  Pre-Diabetes [name] Hart has a diagnosis of prediabetes based on his elevated Hgb A1c and was informed this puts him at greater risk of developing diabetes. He is taking metformin currently, no recent labs and continues to work on diet and exercise to decrease risk of diabetes. He denies nausea or hypoglycemia.  At risk for diabetes [name] Hart is at higher than average risk for developing diabetes due to his obesity and pre-diabetes. He currently denies polyuria or polydipsia.  Hypertension [name] Hart is a 40 y.o. male with hypertension.  [name] Hart denies chest pain or shortness of breath on exertion. He is working weight loss to help control his blood pressure with the goal of decreasing his risk of heart attack and stroke. Patricks blood pressure is currently stable today.  Hyperlipidemia [name] Hart has hyperlipidemia and is currently on Lipitor. He has been trying to improve his cholesterol levels with intensive lifestyle modification including a low saturated fat diet, exercise and weight loss. He denies any chest pain, claudication or myalgias.  Depression Screen [name]'s Food and Mood (modified PHQ-9) score was  Depression screen PHQ 2/9 03/21/2017  Decreased Interest 2  Down, Depressed, Hopeless 2  PHQ - 2 Score 4  Altered sleeping 3  Tired, decreased energy 3  Change in appetite 3  Feeling bad or failure about yourself  1  Trouble concentrating 1  Moving slowly or fidgety/restless 1  Suicidal  thoughts 0  PHQ-9 Score 16    ALLERGIES: Allergies  Allergen Reactions  . Clindamycin Itching and Rash  . Pantoprazole Rash    MEDICATIONS: Current Outpatient Prescriptions on File Prior to Visit  Medication Sig Dispense Refill  . aspirin EC 81 MG tablet Take 81 mg by mouth daily.      [name] Hart  atorvastatin (LIPITOR) 10 MG tablet Take 10 mg by mouth daily.      . COMBIGAN 0.2-0.5 % ophthalmic solution ONE DROP IN EACH EYE TWICE DAILY  5  . esomeprazole (NEXIUM) 40 MG capsule Take 40 mg by mouth daily before breakfast.      . fexofenadine (ALLEGRA) 180 MG tablet Take 180 mg by mouth as needed.    [name] Hart lisinopril (PRINIVIL,ZESTRIL) 5 MG tablet 5 mg daily.    . metFORMIN (GLUCOPHAGE) 500 MG tablet 500 mg daily.    . Omega-3 Fatty Acids (FISH OIL) 1000 MG CAPS Take 1,000 mg by mouth daily.      . vitamin B-12 (CYANOCOBALAMIN) 100 MCG tablet Take 100 mcg by mouth daily. Unknown dose     No current facility-administered medications on file prior to visit.     PAST MEDICAL HISTORY: Past Medical History:  Diagnosis Date  . Acid reflux   . Angina    pain in back lft arm aug 12 echo done in epic  neg  . Back pain   . Concussion    41 yrs old  . Fatty liver   . Gallbladder pain   . GERD (gastroesophageal reflux disease)   . Glaucoma   . Hearing difficulty    high pitch nerve deafness  . Hiatal hernia    sliding hernia  . Hyperlipidemia   . Hypertension   . IBS (irritable bowel syndrome)   . Joint pain   . PONV (postoperative nausea and vomiting)   . Prediabetes   . Seasonal allergies   . Shortness of breath   . Sleep apnea    cpap used  since 06  . SOB (shortness of breath)   . TMJ (dislocation of temporomandibular joint)     PAST SURGICAL HISTORY: Past Surgical History:  Procedure Laterality Date  . CHOLECYSTECTOMY  09  . CHOLESTEATOMA EXCISION  02   x2  lft parotid area  . NASAL SEPTOPLASTY W/ TURBINOPLASTY  11/02/2011   Procedure: NASAL SEPTOPLASTY WITH TURBINATE REDUCTION;  Surgeon: Leonette Most;  Location: [facility] OR;  Service: ENT;  Laterality: Bilateral;  SEPTOPLASTY WITH BILATERAL TURBINATE REDUCTION  . TONSILLECTOMY      SOCIAL HISTORY: Social History  Substance Use Topics  . Smoking status: Never Smoker  . Smokeless tobacco: Never Used  . Alcohol use Yes      Comment: light/occas    FAMILY HISTORY: Family History  Problem Relation Age of Onset  . Cancer Mother   . Hypertension Father   . Diabetes Father   . Hyperlipidemia Father   . Obesity Father     ROS: Review of Systems  Constitutional: Positive for malaise/fatigue.  HENT: Positive for congestion (nasal stuffiness), hearing loss, sinus pain and tinnitus.   Eyes: Positive for blurred vision, double vision and redness.       Vision changes Wear Glasses or Contacts Flashes of Light Floaters  Respiratory: Positive for shortness of breath.   Cardiovascular: Positive for chest pain (chest pain/discomfort). Negative for claudication.  Gastrointestinal: Positive for constipation, diarrhea, heartburn and nausea.  Genitourinary: Positive for frequency.  Musculoskeletal: Positive for back pain, joint  pain and neck pain.       Neck stiffness  Skin: Positive for itching.       dryness  Neurological: Positive for dizziness and headaches.  Endo/Heme/Allergies: Negative for polydipsia.  Psychiatric/Behavioral:       Stress    PHYSICAL EXAM: Blood pressure 118/79, temperature 98.8 F (37.1 C), temperature source Oral, height  (1.626 m), weight 272 lb (123.4 kg), SpO2 98 %. Body mass index is 46.69 kg/m. Physical Exam  Constitutional: He is oriented to person, place, and time. He appears well-developed and well-nourished.  Cardiovascular: Normal rate.   Pulmonary/Chest: Effort normal.  Musculoskeletal: Normal range of motion.  Neurological: He is oriented to person, place, and time.  Skin: Skin is warm and dry.  Psychiatric: He has a normal mood and affect. His behavior is normal.  Vitals reviewed.   RECENT LABS AND TESTS: BMET    Component Value Date/Time   NA 140 10/30/2011 1600   K 4.6 10/30/2011 1600   CL 101 10/30/2011 1600   CO2 27 10/30/2011 1600   GLUCOSE 89 10/30/2011 1600   BUN 12 10/30/2011 1600   CREATININE 0.87 10/30/2011 1600   CALCIUM 10.1 10/30/2011  1600   GFRNONAA >90 10/30/2011 1600   GFRAA >90 10/30/2011 1600   No results found for: HGBA1C No results found for: INSULIN CBC    Component Value Date/Time   WBC 8.5 10/30/2011 1600   RBC 4.74 10/30/2011 1600   HGB 14.3 10/30/2011 1600   HCT 40.6 10/30/2011 1600   PLT 294 10/30/2011 1600   MCV 85.7 10/30/2011 1600   MCH 30.2 10/30/2011 1600   MCHC 35.2 10/30/2011 1600   RDW 12.3 10/30/2011 1600   Iron/TIBC/Ferritin/ %Sat No results found for: IRON, TIBC, FERRITIN, IRONPCTSAT Lipid Panel  No results found for: CHOL, TRIG, HDL, CHOLHDL, VLDL, LDLCALC, LDLDIRECT Hepatic Function Panel  No results found for: PROT, ALBUMIN, AST, ALT, ALKPHOS, BILITOT, BILIDIR, IBILI No results found for: TSH  ECG  shows NSR with a rate of 81 BPM INDIRECT CALORIMETER done today shows a VO2 of 331 and a REE of 2307.    ASSESSMENT AND PLAN: Other fatigue - Plan: EKG 12-Lead, Comprehensive metabolic panel, Vitamin B12, CBC With Differential, Folate, T3, T4, free, TSH, VITAMIN D 25 Hydroxy (Vit-D Deficiency, Fractures)  Shortness of breath on exertion  Prediabetes - Plan: Hemoglobin A1c, Insulin, random  Essential hypertension  Other hyperlipidemia - Plan: Lipid Panel With LDL/HDL Ratio  Depression screening  At risk for diabetes mellitus  Morbid obesity (HCC)  PLAN:  Fatigue Rolla was informed that his fatigue may be related to obesity, depression or many other causes. Labs will be ordered, and in the meanwhile Martrell has agreed to work on diet, exercise and weight loss to help with fatigue. Proper sleep hygiene was discussed including the need for 7-8 hours of quality sleep each night. A sleep study was not ordered based on symptoms and Epworth score.  Dyspnea on exertion Currie's shortness of breath appears to be obesity related and exercise induced. He has agreed to work on weight loss and gradually increase exercise to treat his exercise induced shortness of breath. If Rulon  follows our instructions and loses weight without improvement of his shortness of breath, we will plan to refer to pulmonology. We will monitor this condition regularly. Cannon agrees to this plan.  Pre-Diabetes Edson will continue to work on weight loss, exercise, and decreasing simple carbohydrates in his diet to help decrease the risk  of diabetes. We dicussed metformin including benefits and risks. He was informed that eating too many simple carbohydrates or too many calories at one sitting increases the likelihood of GI side effects. Ebenezer agrees to continue metformin fas directed. Jamere agreed to follow up with [name] as directed to monitor his progress.  Diabetes risk counselling Crosby was given extended (at least 15 minutes) diabetes prevention counseling today. He is 41 y.o. male and has risk factors for diabetes including obesity. We discussed intensive lifestyle modifications today with an emphasis on weight loss as well as increasing exercise and decreasing simple carbohydrates in his diet.  Hypertension We discussed sodium restriction, working on healthy weight loss, and a regular exercise program as the means to achieve improved blood pressure control. Maysen agreed with this plan and agreed to follow up as directed. We will continue to monitor his blood pressure as well as his progress with the above lifestyle modifications. He will continue his medications as prescribed and will watch for signs of hypotension as he continues his lifestyle modifications. We will check labs and follow.  Hyperlipidemia Detroit was informed of the American Heart Association Guidelines emphasizing intensive lifestyle modifications as the first line treatment for hyperlipidemia. We discussed many lifestyle modifications today in depth, and Zackry will continue to work on decreasing saturated fats such as fatty red meat, butter and many fried foods. He will also increase vegetables and lean protein in his  diet and continue to work on exercise and weight loss efforts. We will check labs and follow.  Depression Screen Sally had a strongly positive depression screening. Depression is commonly associated with obesity and often results in emotional eating behaviors. We will monitor this closely and work on CBT to help improve the non-hunger eating patterns. Referral to Psychology may be required if no improvement is seen as he continues in our clinic.  Obesity Gates is currently in the action stage of change and his goal is to continue with weight loss efforts He has agreed to follow the Category 3 plan +200 calories Brently has been instructed to work up to a goal of 150 minutes of combined cardio and strengthening exercise per week for weight loss and overall health benefits. We discussed the following Behavioral Modification Stratagies today: increasing lean protein intake, increasing lower sugar fruits and emotional eating strategies  Demba has agreed to follow up with our clinic in 2 weeks. He was informed of the importance of frequent follow up visits to maximize his success with intensive lifestyle modifications for his multiple health conditions. He was informed we would discuss his lab results at his next visit unless there is a critical issue that needs to be addressed sooner. Dennard agreed to keep his next visit at the agreed upon time to discuss these results.  I, [place] Crane, am acting as scribe for Quillian Quince, MD  I have reviewed the above documentation for accuracy and completeness, and I agree with the above. -Quillian Quince, MD

## 2017-03-22 LAB — VITAMIN D 25 HYDROXY (VIT D DEFICIENCY, FRACTURES): VIT D 25 HYDROXY: 22.3 ng/mL — AB (ref 30.0–100.0)

## 2017-03-22 LAB — COMPREHENSIVE METABOLIC PANEL
ALBUMIN: 4.6 g/dL (ref 3.5–5.5)
ALK PHOS: 67 IU/L (ref 39–117)
ALT: 29 IU/L (ref 0–44)
AST: 20 IU/L (ref 0–40)
Albumin/Globulin Ratio: 1.9 (ref 1.2–2.2)
BUN / CREAT RATIO: 17 (ref 9–20)
BUN: 13 mg/dL (ref 6–20)
Bilirubin Total: 0.3 mg/dL (ref 0.0–1.2)
CO2: 23 mmol/L (ref 18–29)
CREATININE: 0.75 mg/dL — AB (ref 0.76–1.27)
Calcium: 9.6 mg/dL (ref 8.7–10.2)
Chloride: 101 mmol/L (ref 96–106)
GFR calc Af Amer: 134 mL/min/{1.73_m2} (ref >59)
GFR, EST NON AFRICAN AMERICAN: 116 mL/min/{1.73_m2} (ref >59)
GLOBULIN, TOTAL: 2.4 g/dL (ref 1.5–4.5)
Glucose: 90 mg/dL (ref 65–99)
Potassium: 4.7 mmol/L (ref 3.5–5.2)
SODIUM: 140 mmol/L (ref 134–144)
Total Protein: 7 g/dL (ref 6.0–8.5)

## 2017-03-22 LAB — TSH: TSH: 1.71 u[IU]/mL (ref 0.450–4.500)

## 2017-03-22 LAB — T4, FREE: Free T4: 1.39 ng/dL (ref 0.82–1.77)

## 2017-03-22 LAB — LIPID PANEL WITH LDL/HDL RATIO
CHOLESTEROL TOTAL: 158 mg/dL (ref 100–199)
HDL: 34 mg/dL — ABNORMAL LOW (ref >39)
LDL Calculated: 96 mg/dL (ref 0–99)
LDl/HDL Ratio: 2.8 ratio (ref 0.0–3.6)
TRIGLYCERIDES: 138 mg/dL (ref 0–149)
VLDL CHOLESTEROL CAL: 28 mg/dL (ref 5–40)

## 2017-03-22 LAB — CBC WITH DIFFERENTIAL
Basophils Absolute: 0 10*3/uL (ref 0.0–0.2)
Basos: 0 %
EOS (ABSOLUTE): 0.1 10*3/uL (ref 0.0–0.4)
EOS: 1 %
HEMATOCRIT: 39.8 % (ref 37.5–51.0)
Hemoglobin: 13.4 g/dL (ref 13.0–17.7)
IMMATURE GRANULOCYTES: 1 %
Immature Grans (Abs): 0 10*3/uL (ref 0.0–0.1)
LYMPHS ABS: 1.7 10*3/uL (ref 0.7–3.1)
Lymphs: 24 %
MCH: 29.3 pg (ref 26.6–33.0)
MCHC: 33.7 g/dL (ref 31.5–35.7)
MCV: 87 fL (ref 79–97)
MONOCYTES: 8 %
Monocytes Absolute: 0.6 10*3/uL (ref 0.1–0.9)
Neutrophils Absolute: 4.8 10*3/uL (ref 1.4–7.0)
Neutrophils: 66 %
RBC: 4.57 x10E6/uL (ref 4.14–5.80)
RDW: 13.6 % (ref 12.3–15.4)
WBC: 7.2 10*3/uL (ref 3.4–10.8)

## 2017-03-22 LAB — VITAMIN B12: VITAMIN B 12: 499 pg/mL (ref 232–1245)

## 2017-03-22 LAB — FOLATE: Folate: 5.3 ng/mL (ref >3.0)

## 2017-03-22 LAB — HEMOGLOBIN A1C
Est. average glucose Bld gHb Est-mCnc: 131 mg/dL
HEMOGLOBIN A1C: 6.2 % — AB (ref 4.8–5.6)

## 2017-03-22 LAB — INSULIN, RANDOM: INSULIN: 18.8 u[IU]/mL (ref 2.6–24.9)

## 2017-03-22 LAB — T3: T3, Total: 155 ng/dL (ref 71–180)

## 2017-03-26 DIAGNOSIS — M5441 Lumbago with sciatica, right side: Secondary | ICD-10-CM | POA: Diagnosis not present

## 2017-03-29 DIAGNOSIS — K219 Gastro-esophageal reflux disease without esophagitis: Secondary | ICD-10-CM | POA: Diagnosis not present

## 2017-03-29 DIAGNOSIS — E119 Type 2 diabetes mellitus without complications: Secondary | ICD-10-CM | POA: Diagnosis not present

## 2017-03-29 DIAGNOSIS — R21 Rash and other nonspecific skin eruption: Secondary | ICD-10-CM | POA: Diagnosis not present

## 2017-03-29 DIAGNOSIS — M79603 Pain in arm, unspecified: Secondary | ICD-10-CM | POA: Diagnosis not present

## 2017-03-29 DIAGNOSIS — Z79899 Other long term (current) drug therapy: Secondary | ICD-10-CM | POA: Diagnosis not present

## 2017-03-29 DIAGNOSIS — M25512 Pain in left shoulder: Secondary | ICD-10-CM | POA: Diagnosis not present

## 2017-03-29 DIAGNOSIS — R6884 Jaw pain: Secondary | ICD-10-CM | POA: Diagnosis not present

## 2017-03-29 DIAGNOSIS — J3089 Other allergic rhinitis: Secondary | ICD-10-CM | POA: Diagnosis not present

## 2017-03-29 DIAGNOSIS — M26609 Unspecified temporomandibular joint disorder, unspecified side: Secondary | ICD-10-CM | POA: Diagnosis not present

## 2017-03-29 DIAGNOSIS — Z7984 Long term (current) use of oral hypoglycemic drugs: Secondary | ICD-10-CM | POA: Diagnosis not present

## 2017-03-29 DIAGNOSIS — I10 Essential (primary) hypertension: Secondary | ICD-10-CM | POA: Diagnosis not present

## 2017-03-29 DIAGNOSIS — H1045 Other chronic allergic conjunctivitis: Secondary | ICD-10-CM | POA: Diagnosis not present

## 2017-03-29 DIAGNOSIS — R079 Chest pain, unspecified: Secondary | ICD-10-CM | POA: Diagnosis not present

## 2017-03-29 DIAGNOSIS — Z888 Allergy status to other drugs, medicaments and biological substances status: Secondary | ICD-10-CM | POA: Diagnosis not present

## 2017-03-29 DIAGNOSIS — E785 Hyperlipidemia, unspecified: Secondary | ICD-10-CM | POA: Diagnosis not present

## 2017-03-29 DIAGNOSIS — Z881 Allergy status to other antibiotic agents status: Secondary | ICD-10-CM | POA: Diagnosis not present

## 2017-03-29 DIAGNOSIS — J3081 Allergic rhinitis due to animal (cat) (dog) hair and dander: Secondary | ICD-10-CM | POA: Diagnosis not present

## 2017-03-29 DIAGNOSIS — R1013 Epigastric pain: Secondary | ICD-10-CM | POA: Diagnosis not present

## 2017-03-29 DIAGNOSIS — Z7982 Long term (current) use of aspirin: Secondary | ICD-10-CM | POA: Diagnosis not present

## 2017-03-29 DIAGNOSIS — M542 Cervicalgia: Secondary | ICD-10-CM | POA: Diagnosis not present

## 2017-04-04 ENCOUNTER — Ambulatory Visit (INDEPENDENT_AMBULATORY_CARE_PROVIDER_SITE_OTHER): Payer: BLUE CROSS/BLUE SHIELD | Admitting: Family Medicine

## 2017-04-04 ENCOUNTER — Encounter (INDEPENDENT_AMBULATORY_CARE_PROVIDER_SITE_OTHER): Payer: Self-pay | Admitting: Family Medicine

## 2017-04-04 VITALS — BP 143/81 | HR 83 | Temp 98.3°F | Ht 64.0 in | Wt 266.0 lb

## 2017-04-04 DIAGNOSIS — E559 Vitamin D deficiency, unspecified: Secondary | ICD-10-CM

## 2017-04-04 DIAGNOSIS — R7303 Prediabetes: Secondary | ICD-10-CM

## 2017-04-04 DIAGNOSIS — Z9189 Other specified personal risk factors, not elsewhere classified: Secondary | ICD-10-CM | POA: Diagnosis not present

## 2017-04-04 DIAGNOSIS — E785 Hyperlipidemia, unspecified: Secondary | ICD-10-CM | POA: Diagnosis not present

## 2017-04-04 DIAGNOSIS — R0789 Other chest pain: Secondary | ICD-10-CM | POA: Diagnosis not present

## 2017-04-04 MED ORDER — VITAMIN D (ERGOCALCIFEROL) 1.25 MG (50000 UNIT) PO CAPS: 50000.0000 [IU] | ORAL_CAPSULE | ORAL | 0 refills | Status: DC

## 2017-04-04 MED ORDER — METFORMIN HCL 500 MG PO TABS: 500.0000 mg | ORAL_TABLET | Freq: Two times a day (BID) | ORAL | 0 refills | Status: DC

## 2017-04-04 NOTE — Progress Notes (Signed)
Office: [phone]  /  Fax: [phone]   HPI:   Chief Complaint: OBESITY [name] Hart is here to discuss his progress with his obesity treatment plan. He is on the  follow the Category 3 + 200 calories plan and is following his eating plan approximately 85 % of the time. He states he is exercising 0 minutes 0 times per week. Rio did well with weight loss but struggled with cravings and stress eating. He also noted excessive hunger. His weight is 266 lb (120.7 kg) today and has had a weight loss of 6 pounds over a period of 2 weeks since his last visit. He has lost 6 lbs since starting treatment with [name].  Vitamin D deficiency [name] Hart has a diagnosis of vitamin D deficiency. He is not currently taking vit D, he admits fatigue and denies nausea, vomiting or muscle weakness.  Pre-Diabetes [name] Hart has a diagnosis of pre-diabetes based on his elevated Hgb A1c to 6.2 and was informed this puts him at greater risk of developing diabetes. He notes significant polyphagia, especially in the pm. He is taking metformin currently and continues to work on diet and exercise to decrease risk of diabetes. He denies nausea or hypoglycemia.  At risk for diabetes [name] Hart is at higher than average risk for developing diabetes due to his obesity and pre-diabetes. He currently denies polyuria or polydipsia.  Hyperlipidemia [name] Hart has hyperlipidemia, HDL is at 52 and he has been trying to improve his cholesterol levels with intensive lifestyle modification including a low saturated fat diet, exercise and weight loss. He denies any chest pain, claudication or myalgias.   Wt Readings from Last 500 Encounters:  04/04/17 266 lb (120.7 kg)  03/21/17 272 lb (123.4 kg)  02/26/17 275 lb (124.7 kg)  02/05/17 276 lb 9.6 oz (125.5 kg)  11/06/16 276 lb (125.2 kg)  10/31/16 279 lb (126.6 kg)  11/02/11 256 lb 8 oz (116.3 kg)  10/30/11 262 lb 9.1 oz (119.1 kg)     ALLERGIES: Allergies  Allergen Reactions  .  Clindamycin Itching and Rash  . Pantoprazole Rash    MEDICATIONS: Current Outpatient Prescriptions on File Prior to Visit  Medication Sig Dispense Refill  . aspirin EC 81 MG tablet Take 81 mg by mouth daily.      [name] atorvastatin (LIPITOR) 10 MG tablet Take 10 mg by mouth daily.      . COMBIGAN 0.2-0.5 % ophthalmic solution ONE DROP IN EACH EYE TWICE DAILY  5  . esomeprazole (NEXIUM) 40 MG capsule Take 40 mg by mouth daily before breakfast.      . fexofenadine (ALLEGRA) 180 MG tablet Take 180 mg by mouth as needed.    [name] lisinopril (PRINIVIL,ZESTRIL) 5 MG tablet 5 mg daily.    . metFORMIN (GLUCOPHAGE) 500 MG tablet 500 mg daily.    . Omega-3 Fatty Acids (FISH OIL) 1000 MG CAPS Take 1,000 mg by mouth daily.      . vitamin B-12 (CYANOCOBALAMIN) 100 MCG tablet Take 100 mcg by mouth daily. Unknown dose     No current facility-administered medications on file prior to visit.     PAST MEDICAL HISTORY: Past Medical History:  Diagnosis Date  . Acid reflux   . Angina    pain in back lft arm aug 12 echo done in epic  neg  . Back pain   . Concussion    41 yrs old  . Fatty liver   . Gallbladder pain   . GERD (gastroesophageal reflux disease)   .  Glaucoma   . Hearing difficulty    high pitch nerve deafness  . Hiatal hernia    sliding hernia  . Hyperlipidemia   . Hypertension   . IBS (irritable bowel syndrome)   . Joint pain   . PONV (postoperative nausea and vomiting)   . Prediabetes   . Seasonal allergies   . Shortness of breath   . Sleep apnea    cpap used  since 06  . SOB (shortness of breath)   . TMJ (dislocation of temporomandibular joint)     PAST SURGICAL HISTORY: Past Surgical History:  Procedure Laterality Date  . CHOLECYSTECTOMY  09  . CHOLESTEATOMA EXCISION  02   x2  lft parotid area  . NASAL SEPTOPLASTY W/ TURBINOPLASTY  11/02/2011   Procedure: NASAL SEPTOPLASTY WITH TURBINATE REDUCTION;  Surgeon: Leonette Most;  Location: [facility] OR;  Service: ENT;  Laterality:  Bilateral;  SEPTOPLASTY WITH BILATERAL TURBINATE REDUCTION  . TONSILLECTOMY      SOCIAL HISTORY: Social History  Substance Use Topics  . Smoking status: Never Smoker  . Smokeless tobacco: Never Used  . Alcohol use Yes     Comment: light/occas    FAMILY HISTORY: Family History  Problem Relation Age of Onset  . Cancer Mother   . Hypertension Father   . Diabetes Father   . Hyperlipidemia Father   . Obesity Father     ROS: Review of Systems  Constitutional: Positive for malaise/fatigue and weight loss.  Cardiovascular: Negative for chest pain and claudication.  Gastrointestinal: Negative for nausea and vomiting.  Genitourinary: Negative for frequency.  Musculoskeletal: Negative for myalgias.       Negative muscle weakness  Endo/Heme/Allergies: Negative for polydipsia.       Polyphagia Negative hypoglycemia    PHYSICAL EXAM: Blood pressure (!) 143/81, pulse 83, temperature 98.3 F (36.8 C), temperature source Oral, height  (1.626 m), weight 266 lb (120.7 kg), SpO2 98 %. Body mass index is 45.66 kg/m. Physical Exam  Constitutional: He is oriented to person, place, and time. He appears well-developed and well-nourished.  Cardiovascular: Normal rate.   Pulmonary/Chest: Effort normal.  Musculoskeletal: Normal range of motion.  Neurological: He is oriented to person, place, and time.  Skin: Skin is warm and dry.  Psychiatric: He has a normal mood and affect. His behavior is normal.  Vitals reviewed.   RECENT LABS AND TESTS: BMET    Component Value Date/Time   NA 140 03/21/2017 1057   K 4.7 03/21/2017 1057   CL 101 03/21/2017 1057   CO2 23 03/21/2017 1057   GLUCOSE 90 03/21/2017 1057   GLUCOSE 89 10/30/2011 1600   BUN 13 03/21/2017 1057   CREATININE 0.75 (L) 03/21/2017 1057   CALCIUM 9.6 03/21/2017 1057   GFRNONAA 116 03/21/2017 1057   GFRAA 134 03/21/2017 1057   Lab Results  Component Value Date   HGBA1C 6.2 (H) 03/21/2017   Lab Results  Component  Value Date   INSULIN 18.8 03/21/2017   CBC    Component Value Date/Time   WBC 7.2 03/21/2017 1057   WBC 8.5 10/30/2011 1600   RBC 4.57 03/21/2017 1057   RBC 4.74 10/30/2011 1600   HGB 14.3 10/30/2011 1600   HCT 39.8 03/21/2017 1057   PLT 294 10/30/2011 1600   MCV 87 03/21/2017 1057   MCH 29.3 03/21/2017 1057   MCH 30.2 10/30/2011 1600   MCHC 33.7 03/21/2017 1057   MCHC 35.2 10/30/2011 1600   RDW 13.6 03/21/2017 1057  LYMPHSABS 1.7 03/21/2017 1057   EOSABS 0.1 03/21/2017 1057   BASOSABS 0.0 03/21/2017 1057   Iron/TIBC/Ferritin/ %Sat No results found for: IRON, TIBC, FERRITIN, IRONPCTSAT Lipid Panel     Component Value Date/Time   CHOL 158 03/21/2017 1057   TRIG 138 03/21/2017 1057   HDL 34 (L) 03/21/2017 1057   LDLCALC 96 03/21/2017 1057   Hepatic Function Panel     Component Value Date/Time   PROT 7.0 03/21/2017 1057   ALBUMIN 4.6 03/21/2017 1057   AST 20 03/21/2017 1057   ALT 29 03/21/2017 1057   ALKPHOS 67 03/21/2017 1057   BILITOT 0.3 03/21/2017 1057      Component Value Date/Time   TSH 1.710 03/21/2017 1057    ASSESSMENT AND PLAN: Vitamin D deficiency - Plan: Vitamin D, Ergocalciferol, (DRISDOL) 50000 units CAPS capsule  Prediabetes - Plan: metFORMIN (GLUCOPHAGE) 500 MG tablet  Hyperlipidemia, unspecified hyperlipidemia type  At risk for diabetes mellitus  Morbid obesity (HCC) - BMI 45.8  PLAN:  Vitamin D Deficiency Barnard was informed that low vitamin D levels contributes to fatigue and are associated with obesity, breast, and colon cancer. He agrees to start to take prescription Vit D ,000 IU every week 34 with no refills and will follow up for routine testing of vitamin D, at least 2-3 times per year. He was informed of the risk of over-replacement of vitamin D and agrees to not increase his dose unless he discusses this with [name] first.  Pre-Diabetes Hulbert will continue to work on weight loss, exercise, and decreasing simple carbohydrates  in his diet to help decrease the risk of diabetes. We dicussed metformin including benefits and risks. He was informed that eating too many simple carbohydrates or too many calories at one sitting increases the likelihood of GI side effects. Uel agrees to increase metformin to 500 mg bid #60 with no refills and Ellijah will follow up with [name] as directed to monitor his progress.  Diabetes risk counselling Hasheem was given extended (at least 30 minutes) diabetes prevention counseling today. He is 41 y.o. male and has risk factors for diabetes including obesity and pre-diabtes. We discussed intensive lifestyle modifications today with an emphasis on weight loss as well as increasing exercise and decreasing simple carbohydrates in his diet.  Hyperlipidemia Mcclain was informed of the American Heart Association Guidelines emphasizing intensive lifestyle modifications as the first line treatment for hyperlipidemia. We discussed many lifestyle modifications today in depth, and Trinten will continue to work on decreasing saturated fats such as fatty red meat, butter and many fried foods. He will also increase vegetables and lean protein in his diet and continue to work on exercise and weight loss efforts. We will re-check labs in 3 months and Marquett agrees to follow up with our clinic in 2 weeks.  Obesity Clements is currently in the action stage of change. As such, his goal is to continue with weight loss efforts He has agreed to follow the Category 3 plan +300 calories Nethaniel has been instructed to work up to a goal of 150 minutes of combined cardio and strengthening exercise per week for weight loss and overall health benefits. We discussed the following Behavioral Modification Stratagies today: increasing lean protein intake and decreasing simple carbohydrates   Sid has agreed to follow up with our clinic in 2 weeks. He was informed of the importance of frequent follow up visits to maximize his  success with intensive lifestyle modifications for his multiple health conditions.  Erven Colla  Valere Dross, am acting as Education administrator for Dennard Nip, MD  I have reviewed the above documentation for accuracy and completeness, and I agree with the above. -Dennard Nip, MD

## 2017-04-05 DIAGNOSIS — H1045 Other chronic allergic conjunctivitis: Secondary | ICD-10-CM | POA: Diagnosis not present

## 2017-04-09 DIAGNOSIS — R0789 Other chest pain: Secondary | ICD-10-CM | POA: Diagnosis not present

## 2017-04-09 DIAGNOSIS — I10 Essential (primary) hypertension: Secondary | ICD-10-CM | POA: Diagnosis not present

## 2017-04-09 DIAGNOSIS — E7800 Pure hypercholesterolemia, unspecified: Secondary | ICD-10-CM | POA: Diagnosis not present

## 2017-04-10 DIAGNOSIS — K529 Noninfective gastroenteritis and colitis, unspecified: Secondary | ICD-10-CM | POA: Diagnosis not present

## 2017-04-18 ENCOUNTER — Ambulatory Visit (INDEPENDENT_AMBULATORY_CARE_PROVIDER_SITE_OTHER): Payer: BLUE CROSS/BLUE SHIELD | Admitting: Family Medicine

## 2017-04-18 VITALS — BP 125/78 | HR 66 | Temp 97.8°F | Ht 64.0 in | Wt 262.0 lb

## 2017-04-18 DIAGNOSIS — R7303 Prediabetes: Secondary | ICD-10-CM | POA: Diagnosis not present

## 2017-04-18 NOTE — Progress Notes (Signed)
Office: [phone]  /  Fax: [phone]   HPI:   Chief Complaint: OBESITY [name] Hart is here to discuss his progress with his obesity treatment plan. He is on the  follow the Category 3 plan +300 calories and is following his eating plan approximately 50 % of the time. He states he is exercising 0 minutes 0 times per week. [name] Hart had increased eating out and increased celebration eating. He has made a lot of substitutions. He mentioned nacho's again for every visit.  His weight is 262 lb (118.8 kg) today and has had a weight loss of 4 pounds over a period of 2 weeks since his last visit. He has lost 10 lbs since starting treatment with [name].  Pre-Diabetes [name] Hart has a diagnosis of pre-diabetes based on his elevated Hgb A1c and was informed this puts him at greater risk of developing diabetes. He is stable on metformin currently and continues to work on diet and exercise to decrease risk of diabetes. He is struggling to follow plan closely but trying to decease simple carbohydrates. He denies nausea or hypoglycemia.    ALLERGIES: Allergies  Allergen Reactions  . Clindamycin Itching and Rash  . Pantoprazole Rash    MEDICATIONS: Current Outpatient Prescriptions on File Prior to Visit  Medication Sig Dispense Refill  . aspirin EC 81 MG tablet Take 81 mg by mouth daily.      [name] atorvastatin (LIPITOR) 10 MG tablet Take 10 mg by mouth daily.      . COMBIGAN 0.2-0.5 % ophthalmic solution ONE DROP IN EACH EYE TWICE DAILY  5  . esomeprazole (NEXIUM) 40 MG capsule Take 40 mg by mouth daily before breakfast.      . fexofenadine (ALLEGRA) 180 MG tablet Take 180 mg by mouth as needed.    [name] lisinopril (PRINIVIL,ZESTRIL) 5 MG tablet 5 mg daily.    . metFORMIN (GLUCOPHAGE) 500 MG tablet Take 1 tablet (500 mg total) by mouth 2 (two) times daily. 60 tablet 0  . Omega-3 Fatty Acids (FISH OIL) 1000 MG CAPS Take 1,000 mg by mouth daily.      . vitamin B-12 (CYANOCOBALAMIN) 100 MCG tablet Take 100 mcg  by mouth daily. Unknown dose    . Vitamin D, Ergocalciferol, (DRISDOL) 50000 units CAPS capsule Take 1 capsule (50,000 Units total) by mouth every 7 (seven) days. 30 capsule 0   No current facility-administered medications on file prior to visit.     PAST MEDICAL HISTORY: Past Medical History:  Diagnosis Date  . Acid reflux   . Angina    pain in back lft arm aug 12 echo done in epic  neg  . Back pain   . Concussion    41 yrs old  . Fatty liver   . Gallbladder pain   . GERD (gastroesophageal reflux disease)   . Glaucoma   . Hearing difficulty    high pitch nerve deafness  . Hiatal hernia    sliding hernia  . Hyperlipidemia   . Hypertension   . IBS (irritable bowel syndrome)   . Joint pain   . PONV (postoperative nausea and vomiting)   . Prediabetes   . Seasonal allergies   . Shortness of breath   . Sleep apnea    cpap used  since 06  . SOB (shortness of breath)   . TMJ (dislocation of temporomandibular joint)     PAST SURGICAL HISTORY: Past Surgical History:  Procedure Laterality Date  . CHOLECYSTECTOMY  09  . CHOLESTEATOMA EXCISION  02   x2  lft parotid area  . NASAL SEPTOPLASTY W/ TURBINOPLASTY  11/02/2011   Procedure: NASAL SEPTOPLASTY WITH TURBINATE REDUCTION;  Surgeon: Leonette Most;  Location: [facility] OR;  Service: ENT;  Laterality: Bilateral;  SEPTOPLASTY WITH BILATERAL TURBINATE REDUCTION  . TONSILLECTOMY      SOCIAL HISTORY: Social History  Substance Use Topics  . Smoking status: Never Smoker  . Smokeless tobacco: Never Used  . Alcohol use Yes     Comment: light/occas    FAMILY HISTORY: Family History  Problem Relation Age of Onset  . Cancer Mother   . Hypertension Father   . Diabetes Father   . Hyperlipidemia Father   . Obesity Father     ROS: Review of Systems  Constitutional: Positive for weight loss.  Gastrointestinal: Negative for nausea.  Endo/Heme/Allergies:       Negative hypoglycemia    PHYSICAL EXAM: Blood pressure 125/78, pulse  66, temperature 97.8 F (36.6 C), temperature source Oral, height 5\' 4"  (1.626 m), weight 262 lb (118.8 kg), SpO2 99 %. Body mass index is 44.97 kg/m. Physical Exam  Constitutional: He is oriented to person, place, and time. He appears well-developed and well-nourished.  Cardiovascular: Normal rate.   Pulmonary/Chest: Effort normal.  Musculoskeletal: Normal range of motion.  Neurological: He is oriented to person, place, and time.  Skin: Skin is warm and dry.  Psychiatric: He has a normal mood and affect. His behavior is normal.  Vitals reviewed.   RECENT LABS AND TESTS: BMET    Component Value Date/Time   NA 140 03/21/2017 1057   K 4.7 03/21/2017 1057   CL 101 03/21/2017 1057   CO2 23 03/21/2017 1057   GLUCOSE 90 03/21/2017 1057   GLUCOSE 89 10/30/2011 1600   BUN 13 03/21/2017 1057   CREATININE 0.75 (L) 03/21/2017 1057   CALCIUM 9.6 03/21/2017 1057   GFRNONAA 116 03/21/2017 1057   GFRAA 134 03/21/2017 1057   Lab Results  Component Value Date   HGBA1C 6.2 (H) 03/21/2017   Lab Results  Component Value Date   INSULIN 18.8 03/21/2017   CBC    Component Value Date/Time   WBC 7.2 03/21/2017 1057   WBC 8.5 10/30/2011 1600   RBC 4.57 03/21/2017 1057   RBC 4.74 10/30/2011 1600   HGB 14.3 10/30/2011 1600   HCT 39.8 03/21/2017 1057   PLT 294 10/30/2011 1600   MCV 87 03/21/2017 1057   MCH 29.3 03/21/2017 1057   MCH 30.2 10/30/2011 1600   MCHC 33.7 03/21/2017 1057   MCHC 35.2 10/30/2011 1600   RDW 13.6 03/21/2017 1057   LYMPHSABS 1.7 03/21/2017 1057   EOSABS 0.1 03/21/2017 1057   BASOSABS 0.0 03/21/2017 1057   Iron/TIBC/Ferritin/ %Sat No results found for: IRON, TIBC, FERRITIN, IRONPCTSAT Lipid Panel     Component Value Date/Time   CHOL 158 03/21/2017 1057   TRIG 138 03/21/2017 1057   HDL 34 (L) 03/21/2017 1057   LDLCALC 96 03/21/2017 1057   Hepatic Function Panel     Component Value Date/Time   PROT 7.0 03/21/2017 1057   ALBUMIN 4.6 03/21/2017 1057   AST  20 03/21/2017 1057   ALT 29 03/21/2017 1057   ALKPHOS 67 03/21/2017 1057   BILITOT 0.3 03/21/2017 1057      Component Value Date/Time   TSH 1.710 03/21/2017 1057    ASSESSMENT AND PLAN: Prediabetes  Morbid obesity (HCC) - BMI 43.9  PLAN:  Pre-Diabetes [name] Hart will continue to work on weight loss,  exercise, and decreasing simple carbohydrates in his diet to help decrease the risk of diabetes. We dicussed metformin including benefits and risks. He was informed that eating too many simple carbohydrates or too many calories at one sitting increases the likelihood of GI side effects. [name] Hart agrees to continue metformin for now and will follow up with us as directed to monitor his progress.  We spent > than 50% of the 15 minute visit on the counseling as documented in the note.  Obesity [name] Hart is currently in the action stage of change. As such, his goal is to continue with weight loss efforts He has agreed to keep a food journal with 500 to 700 calories and 40 grams of protein  and follow the Category 3 plan [name] Hart has been instructed to work up to a goal of 150 minutes of combined cardio and strengthening exercise per week for weight loss and overall health benefits. We discussed the following Behavioral Modification Strategies today: increasing lean protein intake and decreasing nacho's. [name] Hart was again informed, nacho's were not a good food choice.  [name] Hart has agreed to follow up with our clinic in 2 weeks. He was informed of the importance of frequent follow up visits to maximize his success with intensive lifestyle modifications for his multiple health conditions.  I, [place] CraneJoanne Murray, am acting as scribe for Quillian Quincearen Shrika Milos, MD  I have reviewed the above documentation for accuracy and completeness, and I agree with the above. -Quillian Quincearen Cambre Matson, MD

## 2017-04-19 DIAGNOSIS — M5441 Lumbago with sciatica, right side: Secondary | ICD-10-CM | POA: Diagnosis not present

## 2017-04-20 DIAGNOSIS — R0789 Other chest pain: Secondary | ICD-10-CM | POA: Diagnosis not present

## 2017-04-23 DIAGNOSIS — M5441 Lumbago with sciatica, right side: Secondary | ICD-10-CM | POA: Diagnosis not present

## 2017-05-01 DIAGNOSIS — L237 Allergic contact dermatitis due to plants, except food: Secondary | ICD-10-CM | POA: Diagnosis not present

## 2017-05-03 ENCOUNTER — Ambulatory Visit (INDEPENDENT_AMBULATORY_CARE_PROVIDER_SITE_OTHER): Payer: BLUE CROSS/BLUE SHIELD | Admitting: Family Medicine

## 2017-05-03 VITALS — BP 122/80 | HR 82 | Temp 98.3°F | Ht 64.0 in | Wt 260.0 lb

## 2017-05-03 DIAGNOSIS — Z6841 Body Mass Index (BMI) 40.0 and over, adult: Secondary | ICD-10-CM | POA: Diagnosis not present

## 2017-05-03 DIAGNOSIS — R7303 Prediabetes: Secondary | ICD-10-CM

## 2017-05-03 DIAGNOSIS — Z9189 Other specified personal risk factors, not elsewhere classified: Secondary | ICD-10-CM | POA: Diagnosis not present

## 2017-05-03 DIAGNOSIS — E559 Vitamin D deficiency, unspecified: Secondary | ICD-10-CM

## 2017-05-07 DIAGNOSIS — M5441 Lumbago with sciatica, right side: Secondary | ICD-10-CM | POA: Diagnosis not present

## 2017-05-07 NOTE — Progress Notes (Signed)
Office: [phone]  /  Fax: [phone]   HPI:   Chief Complaint: OBESITY [name] Hart is here to discuss his progress with his obesity treatment plan. He is on the  keep a food journal with 50 to 700 calories and 40 grams of protein  and follow the Category 3 plan and is following his eating plan approximately 65 % of the time. He states he is exercising yard work  1 to 2  times per week. [name] Hart continues to do well with weight loss. He hasn't been journaling all dinners and has been eating out more. His weight is 260 lb (117.9 kg) today and has had a weight loss of 2 pounds over a period of 2 weeks since his last visit. He has lost 12 lbs since starting treatment with us.  Vitamin D deficiency [name] Hart has a diagnosis of vitamin D deficiency. He is currently taking vit D and denies nausea, vomiting or muscle weakness.  Pre-Diabetes [name] Hart has a diagnosis of pre-diabetes based on his elevated Hgb A1c and was informed this puts him at greater risk of developing diabetes. He is taking metformin currently and continues to work on diet and exercise to decrease risk of diabetes. He denies nausea, polyphagia or hypoglycemia.  At risk for diabetes [name] Hart is at higher than average risk for developing diabetes due to his obesity and pre-diabetes. He currently denies polyuria or polydipsia.   ALLERGIES: Allergies  Allergen Reactions  . Clindamycin Itching and Rash  . Pantoprazole Rash    MEDICATIONS: Current Outpatient Prescriptions on File Prior to Visit  Medication Sig Dispense Refill  . aspirin EC 81 MG tablet Take 81 mg by mouth daily.      [name]. atorvastatin (LIPITOR) 10 MG tablet Take 10 mg by mouth daily.      . COMBIGAN 0.2-0.5 % ophthalmic solution ONE DROP IN EACH EYE TWICE DAILY  5  . esomeprazole (NEXIUM) 40 MG capsule Take 40 mg by mouth daily before breakfast.      . fexofenadine (ALLEGRA) 180 MG tablet Take 180 mg by mouth as needed.    [name]. lisinopril (PRINIVIL,ZESTRIL) 5 MG  tablet 5 mg daily.    . metFORMIN (GLUCOPHAGE) 500 MG tablet Take 1 tablet (500 mg total) by mouth 2 (two) times daily. 60 tablet 0  . Omega-3 Fatty Acids (FISH OIL) 1000 MG CAPS Take 1,000 mg by mouth daily.      . vitamin B-12 (CYANOCOBALAMIN) 100 MCG tablet Take 100 mcg by mouth daily. Unknown dose    . Vitamin D, Ergocalciferol, (DRISDOL) 50000 units CAPS capsule Take 1 capsule (50,000 Units total) by mouth every 7 (seven) days. 30 capsule 0   No current facility-administered medications on file prior to visit.     PAST MEDICAL HISTORY: Past Medical History:  Diagnosis Date  . Acid reflux   . Angina    pain in back lft arm aug 12 echo done in epic  neg  . Back pain   . Concussion    41 yrs old  . Fatty liver   . Gallbladder pain   . GERD (gastroesophageal reflux disease)   . Glaucoma   . Hearing difficulty    high pitch nerve deafness  . Hiatal hernia    sliding hernia  . Hyperlipidemia   . Hypertension   . IBS (irritable bowel syndrome)   . Joint pain   . PONV (postoperative nausea and vomiting)   . Prediabetes   . Seasonal allergies   . Shortness  of breath   . Sleep apnea    cpap used  since 06  . SOB (shortness of breath)   . TMJ (dislocation of temporomandibular joint)     PAST SURGICAL HISTORY: Past Surgical History:  Procedure Laterality Date  . CHOLECYSTECTOMY  09  . CHOLESTEATOMA EXCISION  02   x2  lft parotid area  . NASAL SEPTOPLASTY W/ TURBINOPLASTY  11/02/2011   Procedure: NASAL SEPTOPLASTY WITH TURBINATE REDUCTION;  Surgeon: Leonette Most;  Location: [facility] OR;  Service: ENT;  Laterality: Bilateral;  SEPTOPLASTY WITH BILATERAL TURBINATE REDUCTION  . TONSILLECTOMY      SOCIAL HISTORY: Social History  Substance Use Topics  . Smoking status: Never Smoker  . Smokeless tobacco: Never Used  . Alcohol use Yes     Comment: light/occas    FAMILY HISTORY: Family History  Problem Relation Age of Onset  . Cancer Mother   . Hypertension Father   .  Diabetes Father   . Hyperlipidemia Father   . Obesity Father     ROS: Review of Systems  Constitutional: Positive for weight loss.  Gastrointestinal: Negative for nausea and vomiting.  Genitourinary: Negative for frequency.  Musculoskeletal:       Negative muscle weakness   Endo/Heme/Allergies: Negative for polydipsia.       Negative polyphagia Negative hypoglycemia    PHYSICAL EXAM: Blood pressure 122/80, pulse 82, temperature 98.3 F (36.8 C), temperature source Oral, height 5\' 4"  (1.626 m), weight 260 lb (117.9 kg), SpO2 97 %. Body mass index is 44.63 kg/m. Physical Exam  Constitutional: He is oriented to person, place, and time. He appears well-developed and well-nourished.  Cardiovascular: Normal rate.   Pulmonary/Chest: Effort normal.  Musculoskeletal: Normal range of motion.  Neurological: He is oriented to person, place, and time.  Skin: Skin is warm and dry.  Psychiatric: He has a normal mood and affect. His behavior is normal.  Vitals reviewed.   RECENT LABS AND TESTS: BMET    Component Value Date/Time   NA 140 03/21/2017 1057   K 4.7 03/21/2017 1057   CL 101 03/21/2017 1057   CO2 23 03/21/2017 1057   GLUCOSE 90 03/21/2017 1057   GLUCOSE 89 10/30/2011 1600   BUN 13 03/21/2017 1057   CREATININE 0.75 (L) 03/21/2017 1057   CALCIUM 9.6 03/21/2017 1057   GFRNONAA 116 03/21/2017 1057   GFRAA 134 03/21/2017 1057   Lab Results  Component Value Date   HGBA1C 6.2 (H) 03/21/2017   Lab Results  Component Value Date   INSULIN 18.8 03/21/2017   CBC    Component Value Date/Time   WBC 7.2 03/21/2017 1057   WBC 8.5 10/30/2011 1600   RBC 4.57 03/21/2017 1057   RBC 4.74 10/30/2011 1600   HGB 14.3 10/30/2011 1600   HCT 39.8 03/21/2017 1057   PLT 294 10/30/2011 1600   MCV 87 03/21/2017 1057   MCH 29.3 03/21/2017 1057   MCH 30.2 10/30/2011 1600   MCHC 33.7 03/21/2017 1057   MCHC 35.2 10/30/2011 1600   RDW 13.6 03/21/2017 1057   LYMPHSABS 1.7 03/21/2017  1057   EOSABS 0.1 03/21/2017 1057   BASOSABS 0.0 03/21/2017 1057   Iron/TIBC/Ferritin/ %Sat No results found for: IRON, TIBC, FERRITIN, IRONPCTSAT Lipid Panel     Component Value Date/Time   CHOL 158 03/21/2017 1057   TRIG 138 03/21/2017 1057   HDL 34 (L) 03/21/2017 1057   LDLCALC 96 03/21/2017 1057   Hepatic Function Panel     Component Value  Date/Time   PROT 7.0 03/21/2017 1057   ALBUMIN 4.6 03/21/2017 1057   AST 20 03/21/2017 1057   ALT 29 03/21/2017 1057   ALKPHOS 67 03/21/2017 1057   BILITOT 0.3 03/21/2017 1057      Component Value Date/Time   TSH 1.710 03/21/2017 1057    ASSESSMENT AND PLAN: Vitamin D deficiency  Prediabetes  At risk for diabetes mellitus  Morbid obesity (HCC) - BMI 44.7  PLAN:  Vitamin D Deficiency [name] Hart was informed that low vitamin D levels contributes to fatigue and are associated with obesity, breast, and colon cancer. He agrees to continue to take prescription Vit D @50 ,000 IU every week and will follow up for routine testing of vitamin D, at least 2-3 times per year. He was informed of the risk of over-replacement of vitamin D and agrees to not increase his dose unless he discusses this with [name] first. [name] Hart agrees to follow up with our clinic in 2 to 3 weeks.  Pre-Diabetes [name] Hart will continue to work on weight loss, exercise, and decreasing simple carbohydrates in his diet to help decrease the risk of diabetes. We dicussed metformin including benefits and risks. He was informed that eating too many simple carbohydrates or too many calories at one sitting increases the likelihood of GI side effects. [name] Hart agrees to continue to take metformin for now and a prescription was not written today. [name] Hart agreed to follow up with [name] as directed to monitor his progress.  Diabetes risk counselling [name] Hart was given extended (at least 15 minutes) diabetes prevention counseling today. He is 41 y.o. male and has risk factors for diabetes  including obesity and pre-diabetes. We discussed intensive lifestyle modifications today with an emphasis on weight loss as well as increasing exercise and decreasing simple carbohydrates in his diet.  Obesity [name] Hart is currently in the action stage of change. As such, his goal is to continue with weight loss efforts He has agreed to keep a food journal with 500 to 700 calories and 40 grams of protein at supper and follow the Category 3 plan [name] Hart has been instructed to work up to a goal of 150 minutes of combined cardio and strengthening exercise per week for weight loss and overall health benefits. We discussed the following Behavioral Modification Strategies today: increasing H2O, increasing lean protein intake and decrease eating out  Jearl has agreed to follow up with our clinic in 2 to 3 weeks. He was informed of the importance of frequent follow up visits to maximize his success with intensive lifestyle modifications for his multiple health conditions.  I, [name] Hart, am acting as scribe for [name] Quince, MD  I have reviewed the above documentation for accuracy and completeness, and I agree with the above. -[name] Quince, MD  OBESITY BEHAVIORAL INTERVENTION VISIT  Today's visit was # 4 out of 22.  Starting weight: 272 lbs Starting date: 03/21/17 Today's weight : 260 lbs Today's date: 05/03/2017 Total lbs lost to date: 12 (Patients must lose 7 lbs in the first 6 months to continue with counseling)   ASK: We discussed the diagnosis of obesity with [name] Hart today and [name] Hart agreed to give [name] permission to discuss obesity behavioral modification therapy today.  ASSESS: [name] Hart has the diagnosis of obesity and his BMI today is 44.7 [name] Hart is in the action stage of change   ADVISE: [name] Hart was educated on the multiple health risks of obesity as well as the benefit of weight loss to improve his health. He was  advised of the need for long term treatment and the  importance of lifestyle modifications.  AGREE: Multiple dietary modification options and treatment options were discussed and  [name] Hart agreed to keep a food journal with 500 to 700 calories and 40 grams of protein at supper  and follow the Category 3 plan We discussed the following Behavioral Modification Strategies today: increasing H2O, increasing lean protein intake and decrease eating out

## 2017-05-16 DIAGNOSIS — R0602 Shortness of breath: Secondary | ICD-10-CM | POA: Diagnosis not present

## 2017-05-16 DIAGNOSIS — Z9109 Other allergy status, other than to drugs and biological substances: Secondary | ICD-10-CM | POA: Diagnosis not present

## 2017-05-16 DIAGNOSIS — R079 Chest pain, unspecified: Secondary | ICD-10-CM | POA: Diagnosis not present

## 2017-05-18 DIAGNOSIS — M5441 Lumbago with sciatica, right side: Secondary | ICD-10-CM | POA: Diagnosis not present

## 2017-05-21 DIAGNOSIS — L309 Dermatitis, unspecified: Secondary | ICD-10-CM | POA: Diagnosis not present

## 2017-05-22 ENCOUNTER — Other Ambulatory Visit (INDEPENDENT_AMBULATORY_CARE_PROVIDER_SITE_OTHER): Payer: Self-pay | Admitting: Family Medicine

## 2017-05-22 DIAGNOSIS — R7303 Prediabetes: Secondary | ICD-10-CM

## 2017-05-23 ENCOUNTER — Ambulatory Visit (INDEPENDENT_AMBULATORY_CARE_PROVIDER_SITE_OTHER): Payer: BLUE CROSS/BLUE SHIELD | Admitting: Family Medicine

## 2017-05-23 VITALS — BP 119/80 | HR 77 | Temp 97.5°F | Ht 64.0 in | Wt 262.0 lb

## 2017-05-23 DIAGNOSIS — R7303 Prediabetes: Secondary | ICD-10-CM

## 2017-05-23 MED ORDER — METFORMIN HCL 500 MG PO TABS: 500.0000 mg | ORAL_TABLET | Freq: Two times a day (BID) | ORAL | 0 refills | Status: DC

## 2017-05-24 NOTE — Progress Notes (Signed)
Office: [phone]  /  Fax: [phone]   HPI:   Chief Complaint: OBESITY [name] Hart is here to discuss his progress with his obesity treatment plan. He is on the  follow the Category 3 plan and is following his eating plan approximately 50 % of the time. He states he is exercising 0 minutes 0 times per week. [name] Hart has been off track with traveling, cravings and recent prednisone. He would like more lunch options. [name] Hart is struggling to stay motivated at times. His weight is 262 lb (118.8 kg) today and has had a weight gain of 2 pounds over a period pf 3 weeks since his last visit. He has lost 10 lbs since starting treatment with [name].  Pre-Diabetes [name] Hart has a diagnosis of pre-diabetes based on his elevated Hgb A1c and was informed this puts him at greater risk of developing diabetes. He is stable on metformin currently and continues to work on diet and exercise to decrease risk of diabetes. He denies nausea, vomiting or hypoglycemia.   ALLERGIES: Allergies  Allergen Reactions   Clindamycin Itching and Rash   Pantoprazole Rash    MEDICATIONS: Current Outpatient Prescriptions on File Prior to Visit  Medication Sig Dispense Refill   aspirin EC 81 MG tablet Take 81 mg by mouth daily.       atorvastatin (LIPITOR) 10 MG tablet Take 10 mg by mouth daily.       COMBIGAN 0.2-0.5 % ophthalmic solution ONE DROP IN EACH EYE TWICE DAILY  5   esomeprazole (NEXIUM) 40 MG capsule Take 40 mg by mouth daily before breakfast.       fexofenadine (ALLEGRA) 180 MG tablet Take 180 mg by mouth as needed.     lisinopril (PRINIVIL,ZESTRIL) 5 MG tablet 5 mg daily.     Omega-3 Fatty Acids (FISH OIL) 1000 MG CAPS Take 1,000 mg by mouth daily.       vitamin B-12 (CYANOCOBALAMIN) 100 MCG tablet Take 100 mcg by mouth daily. Unknown dose     Vitamin D, Ergocalciferol, (DRISDOL) 50000 units CAPS capsule Take 1 capsule (50,000 Units total) by mouth every 7 (seven) days. 30 capsule 0   No current  facility-administered medications on file prior to visit.     PAST MEDICAL HISTORY: Past Medical History:  Diagnosis Date   Acid reflux    Angina    pain in back lft arm aug 12 echo done in epic  neg   Back pain    Concussion    41 yrs old   Fatty liver    Gallbladder pain    GERD (gastroesophageal reflux disease)    Glaucoma    Hearing difficulty    high pitch nerve deafness   Hiatal hernia    sliding hernia   Hyperlipidemia    Hypertension    IBS (irritable bowel syndrome)    Joint pain    PONV (postoperative nausea and vomiting)    Prediabetes    Seasonal allergies    Shortness of breath    Sleep apnea    cpap used  since 06   SOB (shortness of breath)    TMJ (dislocation of temporomandibular joint)     PAST SURGICAL HISTORY: Past Surgical History:  Procedure Laterality Date   CHOLECYSTECTOMY  09   CHOLESTEATOMA EXCISION  02   x2  lft parotid area   NASAL SEPTOPLASTY W/ TURBINOPLASTY  11/02/2011   Procedure: NASAL SEPTOPLASTY WITH TURBINATE REDUCTION;  Surgeon: Leonette Most;  Location: [facility] OR;  Service:  ENT;  Laterality: Bilateral;  SEPTOPLASTY WITH BILATERAL TURBINATE REDUCTION   TONSILLECTOMY      SOCIAL HISTORY: Social History  Substance Use Topics   Smoking status: Never Smoker   Smokeless tobacco: Never Used   Alcohol use Yes     Comment: light/occas    FAMILY HISTORY: Family History  Problem Relation Age of Onset   Cancer Mother    Hypertension Father    Diabetes Father    Hyperlipidemia Father    Obesity Father     ROS: Review of Systems  Constitutional: Negative for weight loss.  Gastrointestinal: Negative for nausea and vomiting.  Endo/Heme/Allergies:       Negative hypoglycemia    PHYSICAL EXAM: Blood pressure 119/80, pulse 77, temperature 97.5 F (36.4 C), height 5\' 4"  (1.626 m), weight 262 lb (118.8 kg), SpO2 97 %. Body mass index is 44.97 kg/m. Physical Exam  Constitutional: He is oriented to  person, place, and time. He appears well-developed and well-nourished.  Cardiovascular: Normal rate.   Pulmonary/Chest: Effort normal.  Musculoskeletal: Normal range of motion.  Neurological: He is oriented to person, place, and time.  Skin: Skin is warm and dry.  Psychiatric: He has a normal mood and affect. His behavior is normal.  Vitals reviewed.   RECENT LABS AND TESTS: BMET    Component Value Date/Time   NA 140 03/21/2017 1057   K 4.7 03/21/2017 1057   CL 101 03/21/2017 1057   CO2 23 03/21/2017 1057   GLUCOSE 90 03/21/2017 1057   GLUCOSE 89 10/30/2011 1600   BUN 13 03/21/2017 1057   CREATININE 0.75 (L) 03/21/2017 1057   CALCIUM 9.6 03/21/2017 1057   GFRNONAA 116 03/21/2017 1057   GFRAA 134 03/21/2017 1057   Lab Results  Component Value Date   HGBA1C 6.2 (H) 03/21/2017   Lab Results  Component Value Date   INSULIN 18.8 03/21/2017   CBC    Component Value Date/Time   WBC 7.2 03/21/2017 1057   WBC 8.5 10/30/2011 1600   RBC 4.57 03/21/2017 1057   RBC 4.74 10/30/2011 1600   HGB 13.4 03/21/2017 1057   HCT 39.8 03/21/2017 1057   PLT 294 10/30/2011 1600   MCV 87 03/21/2017 1057   MCH 29.3 03/21/2017 1057   MCH 30.2 10/30/2011 1600   MCHC 33.7 03/21/2017 1057   MCHC 35.2 10/30/2011 1600   RDW 13.6 03/21/2017 1057   LYMPHSABS 1.7 03/21/2017 1057   EOSABS 0.1 03/21/2017 1057   BASOSABS 0.0 03/21/2017 1057   Iron/TIBC/Ferritin/ %Sat No results found for: IRON, TIBC, FERRITIN, IRONPCTSAT Lipid Panel     Component Value Date/Time   CHOL 158 03/21/2017 1057   TRIG 138 03/21/2017 1057   HDL 34 (L) 03/21/2017 1057   LDLCALC 96 03/21/2017 1057   Hepatic Function Panel     Component Value Date/Time   PROT 7.0 03/21/2017 1057   ALBUMIN 4.6 03/21/2017 1057   AST 20 03/21/2017 1057   ALT 29 03/21/2017 1057   ALKPHOS 67 03/21/2017 1057   BILITOT 0.3 03/21/2017 1057      Component Value Date/Time   TSH 1.710 03/21/2017 1057    ASSESSMENT AND  PLAN: Prediabetes - Plan: metFORMIN (GLUCOPHAGE) 500 MG tablet  Morbid obesity (HCC)  PLAN:  Pre-Diabetes [name] Hart will continue to work on weight loss, exercise, and decreasing simple carbohydrates in his diet to help decrease the risk of diabetes. We dicussed metformin including benefits and risks. He was informed that eating too many simple carbohydrates or  too many calories at one sitting increases the likelihood of GI side effects. [name] Hart agrees to continue metformin for now and a prescription was written today for 1 month refill. [name] Hart agreed to follow up with [name] as directed to monitor his progress.  We spent > than 50% of the 15 minute visit on the counseling as documented in the note.  Obesity [name] Hart is currently in the action stage of change. As such, his goal is to continue with weight loss efforts He has agreed to keep a food journal with 400 to 600 calories and 35 grams of protein at lunch daily and follow the Category 3 plan Dilyn has been instructed to work up to a goal of 150 minutes of combined cardio and strengthening exercise per week for weight loss and overall health benefits. We discussed the following Behavioral Modification Strategies today: meal planning & cooking strategies, increasing lean protein intake, dealing with family or coworker sabotage and emotional eating strategies  [name] Hart has agreed to follow up with our clinic in 2 to 3 weeks. He was informed of the importance of frequent follow up visits to maximize his success with intensive lifestyle modifications for his multiple health conditions.  I, Nevada Crane, am acting as transcriptionist for Quillian Quince, MD  I have reviewed the above documentation for accuracy and completeness, and I agree with the above. -Quillian Quince, MD   OBESITY BEHAVIORAL INTERVENTION VISIT  Today's visit was # 5 out of 22.  Starting weight: 272 lbs Starting date: 4/18 Today's weight : 262 lbs Today's date:  05/23/2017 Total lbs lost to date: 10 (Patients must lose 7 lbs in the first 6 months to continue with counseling)   ASK: We discussed the diagnosis of obesity with [name] Hart today and [name] Hart agreed to give [name] permission to discuss obesity behavioral modification therapy today.  ASSESS: [name] Hart has the diagnosis of obesity and his BMI today is 45.1 [name] Hart is in the action stage of change   ADVISE: [name] Hart was educated on the multiple health risks of obesity as well as the benefit of weight loss to improve his health. He was advised of the need for long term treatment and the importance of lifestyle modifications.  AGREE: Multiple dietary modification options and treatment options were discussed and  [name] Hart agreed to keep a food journal with 400 to 600 calories and 35 grams of protein at lunch daily and follow the Category 3 plan We discussed the following Behavioral Modification Strategies today: meal planning & cooking strategies, increasing lean protein intake, dealing with family or coworker sabotage and emotional eating strategies

## 2017-05-25 DIAGNOSIS — M5441 Lumbago with sciatica, right side: Secondary | ICD-10-CM | POA: Diagnosis not present

## 2017-05-29 DIAGNOSIS — H01119 Allergic dermatitis of unspecified eye, unspecified eyelid: Secondary | ICD-10-CM | POA: Diagnosis not present

## 2017-05-29 DIAGNOSIS — M545 Low back pain: Secondary | ICD-10-CM | POA: Diagnosis not present

## 2017-06-01 DIAGNOSIS — M5441 Lumbago with sciatica, right side: Secondary | ICD-10-CM | POA: Diagnosis not present

## 2017-06-08 DIAGNOSIS — M5441 Lumbago with sciatica, right side: Secondary | ICD-10-CM | POA: Diagnosis not present

## 2017-06-11 DIAGNOSIS — H40233 Intermittent angle-closure glaucoma, bilateral: Secondary | ICD-10-CM | POA: Diagnosis not present

## 2017-06-13 DIAGNOSIS — M5441 Lumbago with sciatica, right side: Secondary | ICD-10-CM | POA: Diagnosis not present

## 2017-06-14 ENCOUNTER — Ambulatory Visit (INDEPENDENT_AMBULATORY_CARE_PROVIDER_SITE_OTHER): Payer: BLUE CROSS/BLUE SHIELD | Admitting: Family Medicine

## 2017-06-14 VITALS — BP 114/77 | HR 75 | Temp 97.7°F | Ht 64.0 in | Wt 261.0 lb

## 2017-06-14 DIAGNOSIS — IMO0001 Reserved for inherently not codable concepts without codable children: Secondary | ICD-10-CM | POA: Insufficient documentation

## 2017-06-14 DIAGNOSIS — E559 Vitamin D deficiency, unspecified: Secondary | ICD-10-CM

## 2017-06-14 DIAGNOSIS — Z6841 Body Mass Index (BMI) 40.0 and over, adult: Secondary | ICD-10-CM | POA: Diagnosis not present

## 2017-06-14 DIAGNOSIS — E669 Obesity, unspecified: Secondary | ICD-10-CM

## 2017-06-14 MED ORDER — VITAMIN D (ERGOCALCIFEROL) 1.25 MG (50000 UNIT) PO CAPS: 50000.0000 [IU] | ORAL_CAPSULE | ORAL | 0 refills | Status: DC

## 2017-06-14 NOTE — Progress Notes (Signed)
Office: [phone]  /  Fax: [phone]   HPI:   Chief Complaint: OBESITY [name] Hart is here to discuss his progress with his obesity treatment plan. He is on the  follow the Category 3 plan and is following his eating plan approximately 50 % of the time. He states he is exercising 0 minutes 0 times per week. Nelson continues to lose weight. He is deviating from the plan more but is trying to make good choices overall. His weight is 261 lb (118.4 kg) today and has had a weight loss of 1 pound over a period of 3 weeks since his last visit. He has lost 11 lbs since starting treatment with [name].  Vitamin D deficiency [name] Hart has a diagnosis of vitamin D deficiency. He is currently stable on vit D and denies nausea, vomiting or muscle weakness.   ALLERGIES: Allergies  Allergen Reactions   Clindamycin Itching and Rash   Pantoprazole Rash    MEDICATIONS: Current Outpatient Prescriptions on File Prior to Visit  Medication Sig Dispense Refill   aspirin EC 81 MG tablet Take 81 mg by mouth daily.       atorvastatin (LIPITOR) 10 MG tablet Take 10 mg by mouth daily.       COMBIGAN 0.2-0.5 % ophthalmic solution ONE DROP IN EACH EYE TWICE DAILY  5   esomeprazole (NEXIUM) 40 MG capsule Take 40 mg by mouth daily before breakfast.       fexofenadine (ALLEGRA) 180 MG tablet Take 180 mg by mouth as needed.     lisinopril (PRINIVIL,ZESTRIL) 5 MG tablet 5 mg daily.     metFORMIN (GLUCOPHAGE) 500 MG tablet Take 1 tablet (500 mg total) by mouth 2 (two) times daily. 60 tablet 0   Omega-3 Fatty Acids (FISH OIL) 1000 MG CAPS Take 1,000 mg by mouth daily.       vitamin B-12 (CYANOCOBALAMIN) 100 MCG tablet Take 100 mcg by mouth daily. Unknown dose     No current facility-administered medications on file prior to visit.     PAST MEDICAL HISTORY: Past Medical History:  Diagnosis Date   Acid reflux    Angina    pain in back lft arm aug 12 echo done in epic  neg   Back pain     Concussion    41 yrs old   Fatty liver    Gallbladder pain    GERD (gastroesophageal reflux disease)    Glaucoma    Hearing difficulty    high pitch nerve deafness   Hiatal hernia    sliding hernia   Hyperlipidemia    Hypertension    IBS (irritable bowel syndrome)    Joint pain    PONV (postoperative nausea and vomiting)    Prediabetes    Seasonal allergies    Shortness of breath    Sleep apnea    cpap used  since 06   SOB (shortness of breath)    TMJ (dislocation of temporomandibular joint)     PAST SURGICAL HISTORY: Past Surgical History:  Procedure Laterality Date   CHOLECYSTECTOMY  09   CHOLESTEATOMA EXCISION  02   x2  lft parotid area   NASAL SEPTOPLASTY W/ TURBINOPLASTY  11/02/2011   Procedure: NASAL SEPTOPLASTY WITH TURBINATE REDUCTION;  Surgeon: Leonette Most;  Location: [facility] OR;  Service: ENT;  Laterality: Bilateral;  SEPTOPLASTY WITH BILATERAL TURBINATE REDUCTION   TONSILLECTOMY      SOCIAL HISTORY: Social History  Substance Use Topics   Smoking status: Never Smoker  Smokeless tobacco: Never Used   Alcohol use Yes     Comment: light/occas    FAMILY HISTORY: Family History  Problem Relation Age of Onset   Cancer Mother    Hypertension Father    Diabetes Father    Hyperlipidemia Father    Obesity Father     ROS: Review of Systems  Constitutional: Positive for weight loss.  Gastrointestinal: Negative for nausea and vomiting.  Musculoskeletal:       Negative muscle weakness    PHYSICAL EXAM: Blood pressure 114/77, pulse 75, temperature 97.7 F (36.5 C), temperature source Oral, height 5\' 4"  (1.626 m), weight 261 lb (118.4 kg), SpO2 96 %. Body mass index is 44.8 kg/m. Physical Exam  Constitutional: He is oriented to person, place, and time. He appears well-developed and well-nourished.  Cardiovascular: Normal rate.   Pulmonary/Chest: Effort normal.  Musculoskeletal: Normal range of motion.  Neurological: He is  oriented to person, place, and time.  Skin: Skin is warm and dry.  Psychiatric: He has a normal mood and affect. His behavior is normal.  Vitals reviewed.   RECENT LABS AND TESTS: BMET    Component Value Date/Time   NA 140 03/21/2017 1057   K 4.7 03/21/2017 1057   CL 101 03/21/2017 1057   CO2 23 03/21/2017 1057   GLUCOSE 90 03/21/2017 1057   GLUCOSE 89 10/30/2011 1600   BUN 13 03/21/2017 1057   CREATININE 0.75 (L) 03/21/2017 1057   CALCIUM 9.6 03/21/2017 1057   GFRNONAA 116 03/21/2017 1057   GFRAA 134 03/21/2017 1057   Lab Results  Component Value Date   HGBA1C 6.2 (H) 03/21/2017   Lab Results  Component Value Date   INSULIN 18.8 03/21/2017   CBC    Component Value Date/Time   WBC 7.2 03/21/2017 1057   WBC 8.5 10/30/2011 1600   RBC 4.57 03/21/2017 1057   RBC 4.74 10/30/2011 1600   HGB 13.4 03/21/2017 1057   HCT 39.8 03/21/2017 1057   PLT 294 10/30/2011 1600   MCV 87 03/21/2017 1057   MCH 29.3 03/21/2017 1057   MCH 30.2 10/30/2011 1600   MCHC 33.7 03/21/2017 1057   MCHC 35.2 10/30/2011 1600   RDW 13.6 03/21/2017 1057   LYMPHSABS 1.7 03/21/2017 1057   EOSABS 0.1 03/21/2017 1057   BASOSABS 0.0 03/21/2017 1057   Iron/TIBC/Ferritin/ %Sat No results found for: IRON, TIBC, FERRITIN, IRONPCTSAT Lipid Panel     Component Value Date/Time   CHOL 158 03/21/2017 1057   TRIG 138 03/21/2017 1057   HDL 34 (L) 03/21/2017 1057   LDLCALC 96 03/21/2017 1057   Hepatic Function Panel     Component Value Date/Time   PROT 7.0 03/21/2017 1057   ALBUMIN 4.6 03/21/2017 1057   AST 20 03/21/2017 1057   ALT 29 03/21/2017 1057   ALKPHOS 67 03/21/2017 1057   BILITOT 0.3 03/21/2017 1057      Component Value Date/Time   TSH 1.710 03/21/2017 1057    ASSESSMENT AND PLAN: Vitamin D deficiency - Plan: Vitamin D, Ergocalciferol, (DRISDOL) 50000 units CAPS capsule  Class 3 obesity without serious comorbidity with body mass index (BMI) of 40.0 to 44.9 in adult, unspecified  obesity type (HCC)  PLAN:  Vitamin D Deficiency Ebon was informed that low vitamin D levels contributes to fatigue and are associated with obesity, breast, and colon cancer. He agrees to continue to take prescription Vit D @50 ,000 IU every week, we will refill for 1 month and will re-check labs in 2  to 3 weeks and will follow up for routine testing of vitamin D, at least 2-3 times per year. He was informed of the risk of over-replacement of vitamin D and agrees to not increase his dose unless he discusses this with us first. Luisa Hartatrick agrees to follow up with our clinic in 2 to 3 weeks.  Obesity Luisa Hartatrick is currently in the action stage of change. As such, his goal is to continue with weight loss efforts He has agreed to follow the Category 3 plan Luisa Hartatrick has been instructed to work up to a goal of 150 minutes of combined cardio and strengthening exercise per week for weight loss and overall health benefits. We discussed the following Behavioral Modification Strategies today: increasing lean protein intake and better snacking choices  Luisa Hartatrick has agreed to follow up with our clinic in 2 to 3 weeks. He was informed of the importance of frequent follow up visits to maximize his success with intensive lifestyle modifications for his multiple health conditions.  I, [place] CraneJoanne Murray, am acting as transcriptionist for Quillian Quincearen Beasley, MD  I have reviewed the above documentation for accuracy and completeness, and I agree with the above. -Quillian Quincearen Beasley, MD   OBESITY BEHAVIORAL INTERVENTION VISIT  Today's visit was # 6 out of 22.  Starting weight: 272 lbs Starting date: 03/21/17 Today's weight : 261 lbs Today's date: 06/14/2017 Total lbs lost to date: 11 (Patients must lose 7 lbs in the first 6 months to continue with counseling)   ASK: We discussed the diagnosis of obesity with Egbert GaribaldiPatrick L Cillo today and Luisa HartPatrick agreed to give us permission to discuss obesity behavioral modification therapy  today.  ASSESS: Luisa Hartatrick has the diagnosis of obesity and his BMI today is 44.9 Luisa Hartatrick is in the action stage of change   ADVISE: Luisa Hartatrick was educated on the multiple health risks of obesity as well as the benefit of weight loss to improve his health. He was advised of the need for long term treatment and the importance of lifestyle modifications.  AGREE: Multiple dietary modification options and treatment options were discussed and  Luisa Hartatrick agreed to follow the Category 3 plan We discussed the following Behavioral Modification Strategies today: increasing lean protein intake and better snacking choices

## 2017-06-21 DIAGNOSIS — M25512 Pain in left shoulder: Secondary | ICD-10-CM | POA: Diagnosis not present

## 2017-07-03 ENCOUNTER — Ambulatory Visit (INDEPENDENT_AMBULATORY_CARE_PROVIDER_SITE_OTHER): Payer: BLUE CROSS/BLUE SHIELD | Admitting: Physician Assistant

## 2017-07-03 VITALS — BP 127/86 | HR 90 | Temp 98.2°F | Ht 64.0 in | Wt 261.0 lb

## 2017-07-03 DIAGNOSIS — R7303 Prediabetes: Secondary | ICD-10-CM

## 2017-07-03 DIAGNOSIS — Z6841 Body Mass Index (BMI) 40.0 and over, adult: Secondary | ICD-10-CM | POA: Diagnosis not present

## 2017-07-03 DIAGNOSIS — I10 Essential (primary) hypertension: Secondary | ICD-10-CM | POA: Diagnosis not present

## 2017-07-03 DIAGNOSIS — IMO0001 Reserved for inherently not codable concepts without codable children: Secondary | ICD-10-CM

## 2017-07-03 DIAGNOSIS — E559 Vitamin D deficiency, unspecified: Secondary | ICD-10-CM

## 2017-07-03 DIAGNOSIS — E7849 Other hyperlipidemia: Secondary | ICD-10-CM

## 2017-07-03 DIAGNOSIS — E784 Other hyperlipidemia: Secondary | ICD-10-CM

## 2017-07-03 DIAGNOSIS — E669 Obesity, unspecified: Secondary | ICD-10-CM | POA: Diagnosis not present

## 2017-07-03 MED ORDER — METFORMIN HCL 500 MG PO TABS: 500.0000 mg | ORAL_TABLET | Freq: Two times a day (BID) | ORAL | 0 refills | Status: DC

## 2017-07-03 NOTE — Progress Notes (Signed)
Office: [phone]  /  Fax: [phone]   HPI:   Chief Complaint: OBESITY [name] Hart is here to discuss his progress with his obesity treatment plan. He is on the  keep a food journal with 500-700 calories and 40+g protein  and follow the Category 3 plan and is following his eating plan approximately 40 % of the time. He states he is exercising 0 minutes 0 times per week. [name] Hart maintained his weight.  He states he has not been as motivated and has not been following the meal plan as he was eating out more. He is ready to get back on track.  His weight is 261 lb (118.4 kg) today and has not lost weight since his last visit. He has lost 11 lbs since starting treatment with us.  Pre-Diabetes [name] Hart has a diagnosis of prediabetes based on his elevated HgA1c and was informed this puts him at greater risk of developing diabetes. He is taking metformin currently and continues to work on diet and exercise to decrease risk of diabetes. He denies nausea or hypoglycemia.  Hypertension [name] Hart is a 41 y.o. male with hypertension.  [name] Hart denies chest pain or shortness of breath on exertion. He is working weight loss to help control his blood pressure with the goal of decreasing his risk of heart attack and stroke. [name] Hart blood pressure is currently controlled.  Vitamin D deficiency [name] Hart has a diagnosis of vitamin D deficiency. He is currently taking vit D and denies nausea, vomiting or muscle weakness.  Hyperlipidemia [name] Hart has hyperlipidemia and has been trying to improve his cholesterol levels with intensive lifestyle modification including a low saturated fat diet, exercise and weight loss. He denies any chest pain, claudication or myalgias.   ALLERGIES: Allergies  Allergen Reactions  . Clindamycin Itching and Rash  . Pantoprazole Rash    MEDICATIONS: Current Outpatient Prescriptions on File Prior to Visit  Medication Sig Dispense Refill  . aspirin EC 81 MG  tablet Take 81 mg by mouth daily.      [name]. atorvastatin (LIPITOR) 10 MG tablet Take 10 mg by mouth daily.      . COMBIGAN 0.2-0.5 % ophthalmic solution ONE DROP IN EACH EYE TWICE DAILY  5  . esomeprazole (NEXIUM) 40 MG capsule Take 40 mg by mouth daily before breakfast.      . fexofenadine (ALLEGRA) 180 MG tablet Take 180 mg by mouth as needed.    [name]. lisinopril (PRINIVIL,ZESTRIL) 5 MG tablet 5 mg daily.    . metFORMIN (GLUCOPHAGE) 500 MG tablet Take 1 tablet (500 mg total) by mouth 2 (two) times daily. 60 tablet 0  . montelukast (SINGULAIR) 10 MG tablet Take 10 mg by mouth at bedtime.    . Omega-3 Fatty Acids (FISH OIL) 1000 MG CAPS Take 1,000 mg by mouth daily.      . vitamin B-12 (CYANOCOBALAMIN) 100 MCG tablet Take 100 mcg by mouth daily. Unknown dose    . Vitamin D, Ergocalciferol, (DRISDOL) 50000 units CAPS capsule Take 1 capsule (50,000 Units total) by mouth every 7 (seven) days. 4 capsule 0   No current facility-administered medications on file prior to visit.     PAST MEDICAL HISTORY: Past Medical History:  Diagnosis Date  . Acid reflux   . Angina    pain in back lft arm aug 12 echo done in epic  neg  . Back pain   . Concussion    41 yrs old  . Fatty liver   .  Gallbladder pain   . GERD (gastroesophageal reflux disease)   . Glaucoma   . Hearing difficulty    high pitch nerve deafness  . Hiatal hernia    sliding hernia  . Hyperlipidemia   . Hypertension   . IBS (irritable bowel syndrome)   . Joint pain   . PONV (postoperative nausea and vomiting)   . Prediabetes   . Seasonal allergies   . Shortness of breath   . Sleep apnea    cpap used  since 06  . SOB (shortness of breath)   . TMJ (dislocation of temporomandibular joint)     PAST SURGICAL HISTORY: Past Surgical History:  Procedure Laterality Date  . CHOLECYSTECTOMY  09  . CHOLESTEATOMA EXCISION  02   x2  lft parotid area  . NASAL SEPTOPLASTY W/ TURBINOPLASTY  11/02/2011   Procedure: NASAL SEPTOPLASTY WITH  TURBINATE REDUCTION;  Surgeon: Leonette Most;  Location: [facility] OR;  Service: ENT;  Laterality: Bilateral;  SEPTOPLASTY WITH BILATERAL TURBINATE REDUCTION  . TONSILLECTOMY      SOCIAL HISTORY: Social History  Substance Use Topics  . Smoking status: Never Smoker  . Smokeless tobacco: Never Used  . Alcohol use Yes     Comment: light/occas    FAMILY HISTORY: Family History  Problem Relation Age of Onset  . Cancer Mother   . Hypertension Father   . Diabetes Father   . Hyperlipidemia Father   . Obesity Father     ROS: Review of Systems  Respiratory: Negative for shortness of breath.   Cardiovascular: Negative for chest pain and claudication.  Gastrointestinal: Negative for nausea and vomiting.  Musculoskeletal: Negative.  Negative for myalgias.       Muscle weakness  Neurological: Negative for headaches.  Endo/Heme/Allergies:       Negative polyphagia    PHYSICAL EXAM: Blood pressure 127/86, pulse 90, temperature 98.2 F (36.8 C), temperature source Oral, height 5\' 4"  (1.626 m), weight 261 lb (118.4 kg), SpO2 95 %. Body mass index is 44.8 kg/m. Physical Exam  Constitutional: He is oriented to person, place, and time. He appears well-developed and well-nourished.  Cardiovascular: Normal rate.   Pulmonary/Chest: Effort normal.  Musculoskeletal: Normal range of motion.  Neurological: He is alert and oriented to person, place, and time.  Skin: Skin is warm and dry.    RECENT LABS AND TESTS: BMET    Component Value Date/Time   NA 140 03/21/2017 1057   K 4.7 03/21/2017 1057   CL 101 03/21/2017 1057   CO2 23 03/21/2017 1057   GLUCOSE 90 03/21/2017 1057   GLUCOSE 89 10/30/2011 1600   BUN 13 03/21/2017 1057   CREATININE 0.75 (L) 03/21/2017 1057   CALCIUM 9.6 03/21/2017 1057   GFRNONAA 116 03/21/2017 1057   GFRAA 134 03/21/2017 1057   Lab Results  Component Value Date   HGBA1C 6.2 (H) 03/21/2017   Lab Results  Component Value Date   INSULIN 18.8 03/21/2017   CBC     Component Value Date/Time   WBC 7.2 03/21/2017 1057   WBC 8.5 10/30/2011 1600   RBC 4.57 03/21/2017 1057   RBC 4.74 10/30/2011 1600   HGB 13.4 03/21/2017 1057   HCT 39.8 03/21/2017 1057   PLT 294 10/30/2011 1600   MCV 87 03/21/2017 1057   MCH 29.3 03/21/2017 1057   MCH 30.2 10/30/2011 1600   MCHC 33.7 03/21/2017 1057   MCHC 35.2 10/30/2011 1600   RDW 13.6 03/21/2017 1057   LYMPHSABS 1.7 03/21/2017 1057  EOSABS 0.1 03/21/2017 1057   BASOSABS 0.0 03/21/2017 1057   Iron/TIBC/Ferritin/ %Sat No results found for: IRON, TIBC, FERRITIN, IRONPCTSAT Lipid Panel     Component Value Date/Time   CHOL 158 03/21/2017 1057   TRIG 138 03/21/2017 1057   HDL 34 (L) 03/21/2017 1057   LDLCALC 96 03/21/2017 1057   Hepatic Function Panel     Component Value Date/Time   PROT 7.0 03/21/2017 1057   ALBUMIN 4.6 03/21/2017 1057   AST 20 03/21/2017 1057   ALT 29 03/21/2017 1057   ALKPHOS 67 03/21/2017 1057   BILITOT 0.3 03/21/2017 1057      Component Value Date/Time   TSH 1.710 03/21/2017 1057    ASSESSMENT AND PLAN: Prediabetes - Plan: CBC With Differential, Comprehensive metabolic panel, Hemoglobin A1c, Insulin, random, metFORMIN (GLUCOPHAGE) 500 MG tablet  Essential hypertension  Vitamin D deficiency - Plan: VITAMIN D 25 Hydroxy (Vit-D Deficiency, Fractures)  Other hyperlipidemia - Plan: Lipid Panel With LDL/HDL Ratio  Class 3 obesity with serious comorbidity and body mass index (BMI) of 40.0 to 44.9 in adult, unspecified obesity type (HCC)  PLAN:  Pre-Diabetes [name] Hart will continue to work on weight loss, exercise, and decreasing simple carbohydrates in his diet to help decrease the risk of diabetes. We dicussed metformin including benefits and risks. He was informed that eating too many simple carbohydrates or too many calories at one sitting increases the likelihood of GI side effects. [name] HartPatrick requested metformin for now and a prescription was written today. [name] Hart agreed to  follow up with us as directed to monitor his progress.  Hypertension We discussed sodium restriction, working on healthy weight loss, and a regular exercise program as the means to achieve improved blood pressure control. [name] Hart agreed with this plan and agreed to follow up as directed. We will continue to monitor his blood pressure as well as his progress with the above lifestyle modifications. He will continue his medications as prescribed and will watch for signs of hypotension as he continues his lifestyle modifications.  Vitamin D Deficiency [name] Hart was informed that low vitamin D levels contributes to fatigue and are associated with obesity, breast, and colon cancer. He agrees to continue to take prescription Vit D @50 ,000 IU every week and will follow up for routine testing of vitamin D, at least 2-3 times per year. He was informed of the risk of over-replacement of vitamin D and agrees to not increase his dose unless he discusses this with us first.  Hyperlipidemia [name] Hart was informed of the American Heart Association Guidelines emphasizing intensive lifestyle modifications as the first line treatment for hyperlipidemia. We discussed many lifestyle modifications today in depth, and [name] Hart will continue to work on decreasing saturated fats such as fatty red meat, butter and many fried foods. He will also increase vegetables and lean protein in his diet and continue to work on exercise and weight loss efforts.  Obesity [name] Hart is currently in the action stage of change. As such, his goal is to get back to weightloss efforts  He has agreed to keep a food journal with 400-500 calories and 40+g protein  and follow the Category 3 plan [name] Hart has been instructed to work up to a goal of 150 minutes of combined cardio and strengthening exercise per week for weight loss and overall health benefits. We discussed the following Behavioral Modification Stratagies today: increasing lean protein intake,  decrease eating out and work on meal planning and easy cooking plans and keeping a strict food journal.  Tavian has agreed to follow up with our clinic in 2 weeks. He was informed of the importance of frequent follow up visits to maximize his success with intensive lifestyle modifications for his multiple health conditions.   Office: [phone]  /  Fax: [phone]  OBESITY BEHAVIORAL INTERVENTION VISIT  Today's visit was # 7 out of 22.  Starting weight: 272 Starting date: 03/21/2017 Today's weight : Weight: 261 lb (118.4 kg)  Today's date: 07/03/2017 Total lbs lost to date: 11 (Patients must lose 7 lbs in the first 6 months to continue with counseling)   ASK: We discussed the diagnosis of obesity with [name] Hart today and [name] Hart agreed to give [name] permission to discuss obesity behavioral modification therapy today.  ASSESS: Wheeler has the diagnosis of obesity and his BMI today is 44.9 Orlanda is in the action stage of change   ADVISE: Selden was educated on the multiple health risks of obesity as well as the benefit of weight loss to improve his health. He was advised of the need for long term treatment and the importance of lifestyle modifications.  AGREE: Multiple dietary modification options and treatment options were discussed and  Kingsley agreed to follow the Category 3 plan We discussed the following Behavioral Modification Stratagies today: increasing lean protein intake, decrease eating out and work on meal planning and easy cooking plans, keep a strict food journal.     I have reviewed the above documentation for accuracy and completeness, and I agree with the above. -Illa Level, PA-C  I have reviewed the above note and agree with the plan. -Quillian Quince, MD

## 2017-07-04 LAB — COMPREHENSIVE METABOLIC PANEL
A/G RATIO: 2 (ref 1.2–2.2)
ALBUMIN: 4.5 g/dL (ref 3.5–5.5)
ALT: 21 IU/L (ref 0–44)
AST: 17 IU/L (ref 0–40)
Alkaline Phosphatase: 66 IU/L (ref 39–117)
BILIRUBIN TOTAL: 0.3 mg/dL (ref 0.0–1.2)
BUN / CREAT RATIO: 16 (ref 9–20)
BUN: 14 mg/dL (ref 6–20)
CALCIUM: 9.7 mg/dL (ref 8.7–10.2)
CHLORIDE: 99 mmol/L (ref 96–106)
CO2: 22 mmol/L (ref 20–29)
Creatinine, Ser: 0.85 mg/dL (ref 0.76–1.27)
GFR, EST AFRICAN AMERICAN: 127 mL/min/{1.73_m2} (ref >59)
GFR, EST NON AFRICAN AMERICAN: 110 mL/min/{1.73_m2} (ref >59)
Globulin, Total: 2.3 g/dL (ref 1.5–4.5)
Glucose: 93 mg/dL (ref 65–99)
POTASSIUM: 4.2 mmol/L (ref 3.5–5.2)
Sodium: 137 mmol/L (ref 134–144)
TOTAL PROTEIN: 6.8 g/dL (ref 6.0–8.5)

## 2017-07-04 LAB — CBC WITH DIFFERENTIAL
Basophils Absolute: 0 10*3/uL (ref 0.0–0.2)
Basos: 0 %
EOS (ABSOLUTE): 0.1 10*3/uL (ref 0.0–0.4)
EOS: 1 %
HEMATOCRIT: 40 % (ref 37.5–51.0)
HEMOGLOBIN: 13.8 g/dL (ref 13.0–17.7)
IMMATURE GRANULOCYTES: 1 %
Immature Grans (Abs): 0 10*3/uL (ref 0.0–0.1)
Lymphocytes Absolute: 1.5 10*3/uL (ref 0.7–3.1)
Lymphs: 25 %
MCH: 29.2 pg (ref 26.6–33.0)
MCHC: 34.5 g/dL (ref 31.5–35.7)
MCV: 85 fL (ref 79–97)
MONOCYTES: 9 %
Monocytes Absolute: 0.6 10*3/uL (ref 0.1–0.9)
Neutrophils Absolute: 4 10*3/uL (ref 1.4–7.0)
Neutrophils: 64 %
RBC: 4.72 x10E6/uL (ref 4.14–5.80)
RDW: 13.2 % (ref 12.3–15.4)
WBC: 6.2 10*3/uL (ref 3.4–10.8)

## 2017-07-04 LAB — LIPID PANEL WITH LDL/HDL RATIO
Cholesterol, Total: 136 mg/dL (ref 100–199)
HDL: 30 mg/dL — ABNORMAL LOW (ref >39)
LDL Calculated: 79 mg/dL (ref 0–99)
LDl/HDL Ratio: 2.6 ratio (ref 0.0–3.6)
TRIGLYCERIDES: 134 mg/dL (ref 0–149)
VLDL CHOLESTEROL CAL: 27 mg/dL (ref 5–40)

## 2017-07-04 LAB — HEMOGLOBIN A1C
Est. average glucose Bld gHb Est-mCnc: 126 mg/dL
Hgb A1c MFr Bld: 6 % — ABNORMAL HIGH (ref 4.8–5.6)

## 2017-07-04 LAB — VITAMIN D 25 HYDROXY (VIT D DEFICIENCY, FRACTURES): VIT D 25 HYDROXY: 40.7 ng/mL (ref 30.0–100.0)

## 2017-07-04 LAB — INSULIN, RANDOM: INSULIN: 17.3 u[IU]/mL (ref 2.6–24.9)

## 2017-07-11 DIAGNOSIS — G8929 Other chronic pain: Secondary | ICD-10-CM | POA: Diagnosis not present

## 2017-07-11 DIAGNOSIS — M5441 Lumbago with sciatica, right side: Secondary | ICD-10-CM | POA: Diagnosis not present

## 2017-07-16 ENCOUNTER — Ambulatory Visit (INDEPENDENT_AMBULATORY_CARE_PROVIDER_SITE_OTHER): Payer: BLUE CROSS/BLUE SHIELD | Admitting: Physician Assistant

## 2017-07-16 VITALS — BP 126/82 | HR 71 | Temp 97.8°F | Ht 64.0 in | Wt 265.0 lb

## 2017-07-16 DIAGNOSIS — E669 Obesity, unspecified: Secondary | ICD-10-CM

## 2017-07-16 DIAGNOSIS — E559 Vitamin D deficiency, unspecified: Secondary | ICD-10-CM | POA: Diagnosis not present

## 2017-07-16 DIAGNOSIS — Z6841 Body Mass Index (BMI) 40.0 and over, adult: Secondary | ICD-10-CM | POA: Diagnosis not present

## 2017-07-16 DIAGNOSIS — R7303 Prediabetes: Secondary | ICD-10-CM

## 2017-07-16 DIAGNOSIS — IMO0001 Reserved for inherently not codable concepts without codable children: Secondary | ICD-10-CM

## 2017-07-16 MED ORDER — VITAMIN D (ERGOCALCIFEROL) 1.25 MG (50000 UNIT) PO CAPS: 50000.0000 [IU] | ORAL_CAPSULE | ORAL | 0 refills | Status: DC

## 2017-07-16 MED ORDER — METFORMIN HCL 500 MG PO TABS: 500.0000 mg | ORAL_TABLET | Freq: Two times a day (BID) | ORAL | 0 refills | Status: DC

## 2017-07-17 NOTE — Progress Notes (Signed)
Office: [phone]  /  Fax: [phone]   HPI:   Chief Complaint: OBESITY [name] Hart is here to discuss his progress with his obesity treatment plan. He is on the Category 3 plan and is following his eating plan approximately 50 % of the time. He states he is exercising 0 minutes 0 times per week. [name] Hart has not been following the plan and eats out more due to busy work schedule. He states he is not motivated to follow the plan. His weight is 265 lb (120.2 kg) today and has had a weight gain of 4 pounds over a period of 2 weeks since his last visit. He has lost 7 lbs since starting treatment with [name].  Vitamin D deficiency [name] Hart has a diagnosis of vitamin D deficiency. He is currently taking vit D and denies nausea, vomiting or muscle weakness.  Pre-Diabetes [name] Hart has a diagnosis of pre-diabetes based on his elevated Hgb A1c and was informed this puts him at greater risk of developing diabetes. His A1c decreased to 6.0 He is taking metformin currently and continues to work on diet and exercise to decrease risk of diabetes. He denies nausea or hypoglycemia.   ALLERGIES: Allergies  Allergen Reactions  . Clindamycin Itching and Rash  . Pantoprazole Rash    MEDICATIONS: Current Outpatient Prescriptions on File Prior to Visit  Medication Sig Dispense Refill  . aspirin EC 81 MG tablet Take 81 mg by mouth daily.      [name] atorvastatin (LIPITOR) 10 MG tablet Take 10 mg by mouth daily.      . COMBIGAN 0.2-0.5 % ophthalmic solution ONE DROP IN EACH EYE TWICE DAILY  5  . esomeprazole (NEXIUM) 40 MG capsule Take 40 mg by mouth daily before breakfast.      . fexofenadine (ALLEGRA) 180 MG tablet Take 180 mg by mouth as needed.    [name] lisinopril (PRINIVIL,ZESTRIL) 5 MG tablet 5 mg daily.    . montelukast (SINGULAIR) 10 MG tablet Take 10 mg by mouth at bedtime.    . Omega-3 Fatty Acids (FISH OIL) 1000 MG CAPS Take 1,000 mg by mouth daily.      . vitamin B-12 (CYANOCOBALAMIN) 100 MCG tablet Take  100 mcg by mouth daily. Unknown dose     No current facility-administered medications on file prior to visit.     PAST MEDICAL HISTORY: Past Medical History:  Diagnosis Date  . Acid reflux   . Angina    pain in back lft arm aug 12 echo done in epic  neg  . Back pain   . Concussion    41 yrs old  . Fatty liver   . Gallbladder pain   . GERD (gastroesophageal reflux disease)   . Glaucoma   . Hearing difficulty    high pitch nerve deafness  . Hiatal hernia    sliding hernia  . Hyperlipidemia   . Hypertension   . IBS (irritable bowel syndrome)   . Joint pain   . PONV (postoperative nausea and vomiting)   . Prediabetes   . Seasonal allergies   . Shortness of breath   . Sleep apnea    cpap used  since 06  . SOB (shortness of breath)   . TMJ (dislocation of temporomandibular joint)     PAST SURGICAL HISTORY: Past Surgical History:  Procedure Laterality Date  . CHOLECYSTECTOMY  09  . CHOLESTEATOMA EXCISION  02   x2  lft parotid area  . NASAL SEPTOPLASTY W/ TURBINOPLASTY  11/02/2011  Procedure: NASAL SEPTOPLASTY WITH TURBINATE REDUCTION;  Surgeon: Leonette MostJohn M Byers;  Location: [facility] OR;  Service: ENT;  Laterality: Bilateral;  SEPTOPLASTY WITH BILATERAL TURBINATE REDUCTION  . TONSILLECTOMY      SOCIAL HISTORY: Social History  Substance Use Topics  . Smoking status: Never Smoker  . Smokeless tobacco: Never Used  . Alcohol use Yes     Comment: light/occas    FAMILY HISTORY: Family History  Problem Relation Age of Onset  . Cancer Mother   . Hypertension Father   . Diabetes Father   . Hyperlipidemia Father   . Obesity Father     ROS: Review of Systems  Constitutional: Negative for weight loss.  Gastrointestinal: Negative for nausea.  Musculoskeletal:       Negative muscle weakness  Endo/Heme/Allergies:       Negative hypoglycemia    PHYSICAL EXAM: Blood pressure 126/82, pulse 71, temperature 97.8 F (36.6 C), temperature source Oral, height 5\' 4"  (1.626 m),  weight 265 lb (120.2 kg), SpO2 98 %. Body mass index is 45.49 kg/m. Physical Exam  Constitutional: He is oriented to person, place, and time. He appears well-developed and well-nourished.  Cardiovascular: Normal rate.   Pulmonary/Chest: Effort normal.  Musculoskeletal: Normal range of motion.  Neurological: He is oriented to person, place, and time.  Skin: Skin is warm and dry.  Psychiatric: He has a normal mood and affect. His behavior is normal.  Vitals reviewed.   RECENT LABS AND TESTS: BMET    Component Value Date/Time   NA 137 07/03/2017 0829   K 4.2 07/03/2017 0829   CL 99 07/03/2017 0829   CO2 22 07/03/2017 0829   GLUCOSE 93 07/03/2017 0829   GLUCOSE 89 10/30/2011 1600   BUN 14 07/03/2017 0829   CREATININE 0.85 07/03/2017 0829   CALCIUM 9.7 07/03/2017 0829   GFRNONAA 110 07/03/2017 0829   GFRAA 127 07/03/2017 0829   Lab Results  Component Value Date   HGBA1C 6.0 (H) 07/03/2017   HGBA1C 6.2 (H) 03/21/2017   Lab Results  Component Value Date   INSULIN 17.3 07/03/2017   INSULIN 18.8 03/21/2017   CBC    Component Value Date/Time   WBC 6.2 07/03/2017 0829   WBC 8.5 10/30/2011 1600   RBC 4.72 07/03/2017 0829   RBC 4.74 10/30/2011 1600   HGB 13.8 07/03/2017 0829   HCT 40.0 07/03/2017 0829   PLT 294 10/30/2011 1600   MCV 85 07/03/2017 0829   MCH 29.2 07/03/2017 0829   MCH 30.2 10/30/2011 1600   MCHC 34.5 07/03/2017 0829   MCHC 35.2 10/30/2011 1600   RDW 13.2 07/03/2017 0829   LYMPHSABS 1.5 07/03/2017 0829   EOSABS 0.1 07/03/2017 0829   BASOSABS 0.0 07/03/2017 0829   Iron/TIBC/Ferritin/ %Sat No results found for: IRON, TIBC, FERRITIN, IRONPCTSAT Lipid Panel     Component Value Date/Time   CHOL 136 07/03/2017 0829   TRIG 134 07/03/2017 0829   HDL 30 (L) 07/03/2017 0829   LDLCALC 79 07/03/2017 0829   Hepatic Function Panel     Component Value Date/Time   PROT 6.8 07/03/2017 0829   ALBUMIN 4.5 07/03/2017 0829   AST 17 07/03/2017 0829   ALT 21  07/03/2017 0829   ALKPHOS 66 07/03/2017 0829   BILITOT 0.3 07/03/2017 0829      Component Value Date/Time   TSH 1.710 03/21/2017 1057    ASSESSMENT AND PLAN: Vitamin D deficiency - Plan: Vitamin D, Ergocalciferol, (DRISDOL) 50000 units CAPS capsule  Prediabetes - Plan:  metFORMIN (GLUCOPHAGE) 500 MG tablet  Class 3 obesity with serious comorbidity and body mass index (BMI) of 45.0 to 49.9 in adult, unspecified obesity type (HCC)  PLAN:  Vitamin D Deficiency [name] Hart was informed that low vitamin D levels contributes to fatigue and are associated with obesity, breast, and colon cancer. He agrees to continue to take prescription Vit D @50 ,000 IU every week, we will refill for 1 month and will follow up for routine testing of vitamin D, at least 2-3 times per year. He was informed of the risk of over-replacement of vitamin D and agrees to not increase his dose unless he discusses this with [name] first. [name] Hart agrees to follow up with our clinic in 3 weeks.  Pre-Diabetes [name] Hart will continue to work on weight loss, exercise, and decreasing simple carbohydrates in his diet to help decrease the risk of diabetes. We dicussed metformin including benefits and risks. He was informed that eating too many simple carbohydrates or too many calories at one sitting increases the likelihood of GI side effects. [name] Hart agrees to continue metformin for now and a prescription was written today for metformin 500 mg bid #60 with no refills. [name] Hart agreed to follow up with [name] as directed to monitor his progress.  Obesity [name] Hart is currently in the action stage of change. As such, his goal is to continue with weight loss efforts He has agreed to follow the Category 3 plan [name] Hart has been instructed to work up to a goal of 150 minutes of combined cardio and strengthening exercise per week for weight loss and overall health benefits. We discussed the following Behavioral Modification Strategies today: meal  planning & cooking strategies, increasing lean protein intake and emotional eating strategies  [name] Hart has agreed to follow up with our clinic in 3 weeks. He was informed of the importance of frequent follow up visits to maximize his success with intensive lifestyle modifications for his multiple health conditions.  I, [place] Crane, am acting as transcriptionist for Illa Level, PA-C  I have reviewed the above documentation for accuracy and completeness, and I agree with the above. -Illa Level, PA-C  I have reviewed the above note and agree with the plan. Quillian Quince, MD  Office: [phone]  /  Fax: [phone]  OBESITY BEHAVIORAL INTERVENTION VISIT  Today's visit was # 8 out of 22.  Starting weight: 272 lbs Starting date: 03/21/17 Today's weight : 265 lbs Today's date: 07/16/2017 Total lbs lost to date: 7 (Patients must lose 7 lbs in the first 6 months to continue with counseling)   ASK: We discussed the diagnosis of obesity with [name] Hart today and [name] Hart agreed to give [name] permission to discuss obesity behavioral modification therapy today.  ASSESS: [name] Hart has the diagnosis of obesity and his BMI today is 45.6 [name] Hart is in the action stage of change   ADVISE: [name] Hart was educated on the multiple health risks of obesity as well as the benefit of weight loss to improve his health. He was advised of the need for long term treatment and the importance of lifestyle modifications.  AGREE: Multiple dietary modification options and treatment options were discussed and  [name] Hart agreed to follow the Category 3 plan We discussed the following Behavioral Modification Strategies today: meal planning & cooking strategies, increasing lean protein intake and emotional eating strategies

## 2017-07-20 DIAGNOSIS — G8929 Other chronic pain: Secondary | ICD-10-CM | POA: Diagnosis not present

## 2017-07-20 DIAGNOSIS — M5441 Lumbago with sciatica, right side: Secondary | ICD-10-CM | POA: Diagnosis not present

## 2017-08-07 ENCOUNTER — Ambulatory Visit (INDEPENDENT_AMBULATORY_CARE_PROVIDER_SITE_OTHER): Payer: BLUE CROSS/BLUE SHIELD | Admitting: Physician Assistant

## 2017-08-07 VITALS — BP 127/83 | HR 85 | Temp 98.0°F | Ht 64.0 in | Wt 261.0 lb

## 2017-08-07 DIAGNOSIS — Z6841 Body Mass Index (BMI) 40.0 and over, adult: Secondary | ICD-10-CM

## 2017-08-07 DIAGNOSIS — R7303 Prediabetes: Secondary | ICD-10-CM | POA: Diagnosis not present

## 2017-08-07 DIAGNOSIS — IMO0001 Reserved for inherently not codable concepts without codable children: Secondary | ICD-10-CM

## 2017-08-07 DIAGNOSIS — E669 Obesity, unspecified: Secondary | ICD-10-CM | POA: Diagnosis not present

## 2017-08-07 DIAGNOSIS — E559 Vitamin D deficiency, unspecified: Secondary | ICD-10-CM

## 2017-08-07 MED ORDER — VITAMIN D (ERGOCALCIFEROL) 1.25 MG (50000 UNIT) PO CAPS: 50000.0000 [IU] | ORAL_CAPSULE | ORAL | 0 refills | Status: AC

## 2017-08-07 MED ORDER — METFORMIN HCL 500 MG PO TABS
500.0000 mg | ORAL_TABLET | Freq: Two times a day (BID) | ORAL | 0 refills | Status: DC
Start: 2017-08-07 — End: 2017-09-03

## 2017-08-08 NOTE — Progress Notes (Signed)
Office: [phone]  /  Fax: [phone]   HPI:   Chief Complaint: OBESITY [name] Hart is here to discuss his progress with his obesity treatment plan. He is on the Category 3 plan and is following his eating plan approximately 50 % of the time. He states he is exercising 0 minutes 0 times per week. [name] Hart continues to do well with weight loss. He is still not following the plan but he is making smarter food choices.[name].  His weight is 261 lb (118.4 kg) today and has had a weight loss of 4 pounds over a period of 3 weeks since his last visit. He has lost 11 lbs since starting treatment with us.  Vitamin D deficiency [name] Hart has a diagnosis of vitamin D deficiency. He is currently taking vit D and denies nausea, vomiting or muscle weakness.  Pre-Diabetes [name] Hart has a diagnosis of pre-diabetes based on his elevated Hgb A1c and was informed this puts him at greater risk of developing diabetes. He is taking metformin currently and continues to work on diet and exercise to decrease risk of diabetes. He denies nausea, polyphagia or hypoglycemia.   ALLERGIES: Allergies  Allergen Reactions  . Clindamycin Itching and Rash  . Pantoprazole Rash    MEDICATIONS: Current Outpatient Prescriptions on File Prior to Visit  Medication Sig Dispense Refill  . aspirin EC 81 MG tablet Take 81 mg by mouth daily.      [name]. atorvastatin (LIPITOR) 10 MG tablet Take 10 mg by mouth daily.      [name]. esomeprazole (NEXIUM) 40 MG capsule Take 40 mg by mouth daily before breakfast.      . lisinopril (PRINIVIL,ZESTRIL) 5 MG tablet 5 mg daily.    . montelukast (SINGULAIR) 10 MG tablet Take 10 mg by mouth at bedtime.    . Omega-3 Fatty Acids (FISH OIL) 1000 MG CAPS Take 1,000 mg by mouth daily.      . vitamin B-12 (CYANOCOBALAMIN) 100 MCG tablet Take 100 mcg by mouth daily. Unknown dose     No current facility-administered medications on file prior to visit.     PAST MEDICAL HISTORY: Past Medical History:  Diagnosis  Date  . Acid reflux   . Angina    pain in back lft arm aug 12 echo done in epic  neg  . Back pain   . Concussion    41 yrs old  . Fatty liver   . Gallbladder pain   . GERD (gastroesophageal reflux disease)   . Glaucoma   . Hearing difficulty    high pitch nerve deafness  . Hiatal hernia    sliding hernia  . Hyperlipidemia   . Hypertension   . IBS (irritable bowel syndrome)   . Joint pain   . PONV (postoperative nausea and vomiting)   . Prediabetes   . Seasonal allergies   . Shortness of breath   . Sleep apnea    cpap used  since 06  . SOB (shortness of breath)   . TMJ (dislocation of temporomandibular joint)     PAST SURGICAL HISTORY: Past Surgical History:  Procedure Laterality Date  . CHOLECYSTECTOMY  09  . CHOLESTEATOMA EXCISION  02   x2  lft parotid area  . NASAL SEPTOPLASTY W/ TURBINOPLASTY  11/02/2011   Procedure: NASAL SEPTOPLASTY WITH TURBINATE REDUCTION;  Surgeon: Leonette MostJohn M Byers;  Location: [facility] OR;  Service: ENT;  Laterality: Bilateral;  SEPTOPLASTY WITH BILATERAL TURBINATE REDUCTION  . TONSILLECTOMY      SOCIAL HISTORY: Social History  Substance Use Topics  . Smoking status: Never Smoker  . Smokeless tobacco: Never Used  . Alcohol use Yes     Comment: light/occas    FAMILY HISTORY: Family History  Problem Relation Age of Onset  . Cancer Mother   . Hypertension Father   . Diabetes Father   . Hyperlipidemia Father   . Obesity Father     ROS: Review of Systems  Constitutional: Positive for weight loss.  Gastrointestinal: Negative for nausea and vomiting.  Genitourinary: Negative for frequency.  Musculoskeletal:       Negative muscle weakness  Endo/Heme/Allergies: Negative for polydipsia.       Negative polyphagia Negative hypoglycemia    PHYSICAL EXAM: Blood pressure 127/83, pulse 85, temperature 98 F (36.7 C), height 5\' 4"  (1.626 m), weight 261 lb (118.4 kg), SpO2 96 %. Body mass index is 44.8 kg/m. Physical Exam  Constitutional: He is  oriented to person, place, and time. He appears well-developed and well-nourished.  Cardiovascular: Normal rate.   Pulmonary/Chest: Effort normal.  Musculoskeletal: Normal range of motion.  Neurological: He is oriented to person, place, and time.  Skin: Skin is warm and dry.  Psychiatric: He has a normal mood and affect. His behavior is normal.  Vitals reviewed.   RECENT LABS AND TESTS: BMET    Component Value Date/Time   NA 137 07/03/2017 0829   K 4.2 07/03/2017 0829   CL 99 07/03/2017 0829   CO2 22 07/03/2017 0829   GLUCOSE 93 07/03/2017 0829   GLUCOSE 89 10/30/2011 1600   BUN 14 07/03/2017 0829   CREATININE 0.85 07/03/2017 0829   CALCIUM 9.7 07/03/2017 0829   GFRNONAA 110 07/03/2017 0829   GFRAA 127 07/03/2017 0829   Lab Results  Component Value Date   HGBA1C 6.0 (H) 07/03/2017   HGBA1C 6.2 (H) 03/21/2017   Lab Results  Component Value Date   INSULIN 17.3 07/03/2017   INSULIN 18.8 03/21/2017   CBC    Component Value Date/Time   WBC 6.2 07/03/2017 0829   WBC 8.5 10/30/2011 1600   RBC 4.72 07/03/2017 0829   RBC 4.74 10/30/2011 1600   HGB 13.8 07/03/2017 0829   HCT 40.0 07/03/2017 0829   PLT 294 10/30/2011 1600   MCV 85 07/03/2017 0829   MCH 29.2 07/03/2017 0829   MCH 30.2 10/30/2011 1600   MCHC 34.5 07/03/2017 0829   MCHC 35.2 10/30/2011 1600   RDW 13.2 07/03/2017 0829   LYMPHSABS 1.5 07/03/2017 0829   EOSABS 0.1 07/03/2017 0829   BASOSABS 0.0 07/03/2017 0829   Iron/TIBC/Ferritin/ %Sat No results found for: IRON, TIBC, FERRITIN, IRONPCTSAT Lipid Panel     Component Value Date/Time   CHOL 136 07/03/2017 0829   TRIG 134 07/03/2017 0829   HDL 30 (L) 07/03/2017 0829   LDLCALC 79 07/03/2017 0829   Hepatic Function Panel     Component Value Date/Time   PROT 6.8 07/03/2017 0829   ALBUMIN 4.5 07/03/2017 0829   AST 17 07/03/2017 0829   ALT 21 07/03/2017 0829   ALKPHOS 66 07/03/2017 0829   BILITOT 0.3 07/03/2017 0829      Component Value Date/Time     TSH 1.710 03/21/2017 1057    ASSESSMENT AND PLAN: Prediabetes - Plan: metFORMIN (GLUCOPHAGE) 500 MG tablet  Vitamin D deficiency - Plan: Vitamin D, Ergocalciferol, (DRISDOL) 50000 units CAPS capsule  Class 3 obesity with serious comorbidity and body mass index (BMI) of 40.0 to 44.9 in adult, unspecified obesity type (HCC)  PLAN:  Vitamin D Deficiency Wolfgang was informed that low vitamin D levels contributes to fatigue and are associated with obesity, breast, and colon cancer. He agrees to continue to take prescription Vit D @50 ,000 IU every week and will follow up for routine testing of vitamin D, at least 2-3 times per year. He was informed of the risk of over-replacement of vitamin D and agrees to not increase his dose unless he discusses this with [name] first.  Pre-Diabetes Nicoli will continue to work on weight loss, exercise, and decreasing simple carbohydrates in his diet to help decrease the risk of diabetes. We dicussed metformin including benefits and risks. He was informed that eating too many simple carbohydrates or too many calories at one sitting increases the likelihood of GI side effects. Levelle agrees to continue metformin for now and a prescription was written today for metformin 500 mg bid #60 with no refills. Charels agreed to follow up with [name] as directed to monitor his progress.  Obesity Doyne is currently in the action stage of change. As such, his goal is to continue with weight loss efforts He has agreed to keep a food journal with 1500 calories and 90+ grams of protein daily Faraaz has been instructed to work up to a goal of 150 minutes of combined cardio and strengthening exercise per week for weight loss and overall health benefits. We discussed the following Behavioral Modification Strategies today: increasing lean protein intake and work on meal planning and easy cooking plans  Clydell has agreed to follow up with our clinic in 2 weeks. He was informed of  the importance of frequent follow up visits to maximize his success with intensive lifestyle modifications for his multiple health conditions.  I, [place] Crane, am acting as transcriptionist for Illa Level, PA-C  I have reviewed the above documentation for accuracy and completeness, and I agree with the above. -Illa Level, PA-C  I have reviewed the above note and agree with the plan. -Quillian Quince, MD   OBESITY BEHAVIORAL INTERVENTION VISIT  Today's visit was # 9 out of 22.  Starting weight: 272 lbs Starting date: 03/21/17 Today's weight : 261 lbs Today's date: 08/07/2017 Total lbs lost to date: 11 (Patients must lose 7 lbs in the first 6 months to continue with counseling)   ASK: We discussed the diagnosis of obesity with [name] today and [name] Hart agreed to give [name] permission to discuss obesity behavioral modification therapy today.  ASSESS: Kellan has the diagnosis of obesity and his BMI today is 44.78 Slater is in the action stage of change   ADVISE: Jahmari was educated on the multiple health risks of obesity as well as the benefit of weight loss to improve his health. He was advised of the need for long term treatment and the importance of lifestyle modifications.  AGREE: Multiple dietary modification options and treatment options were discussed and  Ab agreed to keep a food journal with 1500 calories and 90+ grams of protein  We discussed the following Behavioral Modification Strategies today: increasing lean protein intake and work on meal planning and easy cooking plans

## 2017-08-21 ENCOUNTER — Ambulatory Visit (INDEPENDENT_AMBULATORY_CARE_PROVIDER_SITE_OTHER): Payer: BLUE CROSS/BLUE SHIELD | Admitting: Physician Assistant

## 2017-08-24 DIAGNOSIS — G4733 Obstructive sleep apnea (adult) (pediatric): Secondary | ICD-10-CM | POA: Diagnosis not present

## 2017-08-29 ENCOUNTER — Ambulatory Visit (INDEPENDENT_AMBULATORY_CARE_PROVIDER_SITE_OTHER): Payer: BLUE CROSS/BLUE SHIELD | Admitting: Adult Health

## 2017-08-29 ENCOUNTER — Encounter: Payer: Self-pay | Admitting: Adult Health

## 2017-08-29 VITALS — BP 139/89 | HR 78 | Wt 269.2 lb

## 2017-08-29 DIAGNOSIS — Z9989 Dependence on other enabling machines and devices: Secondary | ICD-10-CM | POA: Diagnosis not present

## 2017-08-29 DIAGNOSIS — G4733 Obstructive sleep apnea (adult) (pediatric): Secondary | ICD-10-CM

## 2017-08-29 NOTE — Patient Instructions (Signed)
Your Plan:  Continue using CPAP nightly Please bring your memory card by our office for a download If your symptoms worsen or you develop new symptoms please let us know.   Thank you for coming to see [name] at [facility]. I hope we have been able to provide you high quality care today.  You may receive a patient satisfaction survey over the next few weeks. We would appreciate your feedback and comments so that we may continue to improve ourselves and the health of our patients.

## 2017-08-29 NOTE — Progress Notes (Addendum)
PATIENT: [name] Hart DOB: Apr 14, 1976  REASON FOR VISIT: follow up HISTORY FROM: patient  HISTORY OF PRESENT ILLNESS: Today 08/29/17 [name] Hart is a 41 year old male with a history of obstructive sleep apnea on CPAP. He returns today for follow-up. He did not bring his memory chip with him today. He reports that he uses his machine nightly. He does state that he has an old machine and would like to replace it. Reports that he has met his deductible and would like to try to get a new machine. Patient feels that the machine worked well for him. He does state that he tends to stay up too late playing video games and therefore he is sleepy the next day. He states that he does not go 1 night without using his CPAP. He returns today for an evaluation.  HISTORY [name] Hart is a 41 year old right-handed gentleman with an underlying medical history of concussion is a preschooler, reflux disease, hiatal hernia, hypertension, history of angina, dizziness, and recurrent headaches, who presents for follow-up consultation of his obstructive sleep apnea, after her recent split-night sleep study. I first met him on 11/06/2016 at the request of Dr. Leta Hart, , at which time he reported a prior diagnosis of OSA over 10 years prior and he was on CPAP therapy. He reported compliance with CPAP but also had interim weight gain, needed a new machine as the humidifier was defective. I invited him for sleep study. He had a split-night sleep study and 11/22/2016. I went over his test results with him in detail today. Sleep latency was 9 minutes at baseline, REM latency high-normal at 119 minutes, sleep efficiency 87.4%, he had an increased percentage of light stage sleep, slow-wave sleep was 19.5% and REM sleep was 5.4%. Total AHI was 30.8 per hour, REM AHI 128.6 per hour. Average oxygen saturation was 96%, nadir was 83%. He had mild PLMS with an index of 16.3 per hour, minimal arousals. He was then titrated with CPAP  starting at 5 cm and advanced to 11 cm. AHI was 0 per hour on the final pressure with O2 nadir of 93% and supine REM sleep achieved. Average oxygen saturation was 96%, nadir was 89%, he had no significant PLMS during the second part of the study. Based on this test results I prescribed CPAP at 11 cm and a new machine and supplies.   02/26/2017  I reviewed his CPAP compliance data from 10/07/2016 through 11/05/2016, which is a total of 30 days, during which time he used his machine every night with percent used days greater than 4 hours at 100%, indicating superb compliance with an average usage of 8 hours and 8 minutes, residual AHI information not available but CPAP was his old pressure of 14 cm rather than 11. He reports he has been using old CPAP with the new settings. He was not able to get a new CPAP machine. He had met his insurance deductible by the end of the year but by the time his new CPAP order was process, it was after the first of the year and he could not get a new machine. He still does not use his humidifier. The pressure at 11 cm is better tolerable to him, he still has dry mouth, he tried a nasal mask with a chinstrap but could not tolerate the chin strap. He went back to using his fullface mask. We will try to get more recent compliance data and he has a compliance card in his machine  but he did not bring it today.    REVIEW OF SYSTEMS: Out of a complete 14 system review of symptoms, the patient complains only of the following symptoms, and all other reviewed systems are negative.  Hearing loss, ear pain, ringing in ears, light sensitivity, shortness of breath, abdominal pain, constipation, diarrhea, nausea, incontinence of bowels, apnea, daytime sleepiness, back pain, neck pain, neck stiffness, environmental allergies, headache  ALLERGIES: Allergies  Allergen Reactions  . Clindamycin Itching and Rash  . Pantoprazole Rash    HOME MEDICATIONS: Outpatient Medications Prior to  Visit  Medication Sig Dispense Refill  . aspirin EC 81 MG tablet Take 81 mg by mouth daily.      [name] atorvastatin (LIPITOR) 10 MG tablet Take 10 mg by mouth daily.      . cetirizine (ZYRTEC) 10 MG tablet Take 10 mg by mouth daily.    . COSOPT PF 22.3-6.8 MG/ML SOLN ophthalmic solution INSTILL ONE DROP IN EACH EYE TWICE DAILY  5  . dorzolamidel-timolol (COSOPT PF) 22.3-6.8 MG/ML SOLN ophthalmic solution INSTILL ONE DROP IN [facility] EYE TWICE DAILY    . esomeprazole (NEXIUM) 40 MG capsule Take 40 mg by mouth daily before breakfast.      . lisinopril (PRINIVIL,ZESTRIL) 5 MG tablet 5 mg daily.    . metFORMIN (GLUCOPHAGE) 500 MG tablet Take 1 tablet (500 mg total) by mouth 2 (two) times daily. 60 tablet 0  . montelukast (SINGULAIR) 10 MG tablet Take 10 mg by mouth at bedtime.    . Omega-3 Fatty Acids (FISH OIL) 1000 MG CAPS Take 1,000 mg by mouth daily.      . vitamin B-12 (CYANOCOBALAMIN) 100 MCG tablet Take 100 mcg by mouth daily. Unknown dose    . Vitamin D, Ergocalciferol, (DRISDOL) 50000 units CAPS capsule Take 1 capsule (50,000 Units total) by mouth every 7 (seven) days. 4 capsule 0   No facility-administered medications prior to visit.     PAST MEDICAL HISTORY: Past Medical History:  Diagnosis Date  . Acid reflux   . Angina    pain in back lft arm aug 12 echo done in epic  neg  . Back pain   . Concussion    41 yrs old  . Fatty liver   . Gallbladder pain   . GERD (gastroesophageal reflux disease)   . Glaucoma   . Hearing difficulty    high pitch nerve deafness  . Hiatal hernia    sliding hernia  . Hyperlipidemia   . Hypertension   . IBS (irritable bowel syndrome)   . Joint pain   . PONV (postoperative nausea and vomiting)   . Prediabetes   . Seasonal allergies   . Shortness of breath   . Sleep apnea    cpap used  since 06  . SOB (shortness of breath)   . TMJ (dislocation of temporomandibular joint)     PAST SURGICAL HISTORY: Past Surgical History:  Procedure Laterality  Date  . CHOLECYSTECTOMY  09  . CHOLESTEATOMA EXCISION  02   x2  lft parotid area  . NASAL SEPTOPLASTY W/ TURBINOPLASTY  11/02/2011   Procedure: NASAL SEPTOPLASTY WITH TURBINATE REDUCTION;  Surgeon: Cecil Cranker;  Location: [facility] OR;  Service: ENT;  Laterality: Bilateral;  SEPTOPLASTY WITH BILATERAL TURBINATE REDUCTION  . TONSILLECTOMY      FAMILY HISTORY: Family History  Problem Relation Age of Onset  . Cancer Mother   . Hypertension Father   . Diabetes Father   . Hyperlipidemia Father   .  Obesity Father     SOCIAL HISTORY: Social History   Social History  . Marital status: Married    Spouse name: April  . Number of children: 2  . Years of education: 77   Occupational History  . engineer     [employer]   Social History Main Topics  . Smoking status: Never Smoker  . Smokeless tobacco: Never Used  . Alcohol use Yes     Comment: light/occas  . Drug use: No  . Sexual activity: Yes    Partners: Female   Other Topics Concern  . Not on file   Social History Narrative   Lives with family   Caffeine - coffee, 2 20 oz cups daily; sodas, 3+ daily      PHYSICAL EXAM  Vitals:   08/29/17 1505  BP: 139/89  Pulse: 78  Weight: 269 lb 3.2 oz (122.1 kg)   Body mass index is 46.21 kg/m.  Generalized: Well developed, in no acute distress   Neurological examination  Mentation: Alert oriented to time, place, history taking. Follows all commands speech and language fluent Cranial nerve II-XII: Pupils were equal round reactive to light. Extraocular movements were full, visual field were full on confrontational test. Facial sensation and strength were normal. Uvula tongue midline. Head turning and shoulder shrug  were normal and symmetric. Mallampati 3+ Motor: The motor testing reveals 5 over 5 strength of all 4 extremities. Good symmetric motor tone is noted throughout.  Sensory: Sensory testing is intact to soft touch on all 4 extremities. No evidence of extinction is noted.    Coordination: Cerebellar testing reveals good finger-nose-finger and heel-to-shin bilaterally.  Gait and station: Gait is normal.   DIAGNOSTIC DATA (LABS, IMAGING, TESTING) - I reviewed patient records, labs, notes, testing and imaging myself where available.  Lab Results  Component Value Date   WBC 6.2 07/03/2017   HGB 13.8 07/03/2017   HCT 40.0 07/03/2017   MCV 85 07/03/2017   PLT 294 10/30/2011      Component Value Date/Time   NA 137 07/03/2017 0829   K 4.2 07/03/2017 0829   CL 99 07/03/2017 0829   CO2 22 07/03/2017 0829   GLUCOSE 93 07/03/2017 0829   GLUCOSE 89 10/30/2011 1600   BUN 14 07/03/2017 0829   CREATININE 0.85 07/03/2017 0829   CALCIUM 9.7 07/03/2017 0829   PROT 6.8 07/03/2017 0829   ALBUMIN 4.5 07/03/2017 0829   AST 17 07/03/2017 0829   ALT 21 07/03/2017 0829   ALKPHOS 66 07/03/2017 0829   BILITOT 0.3 07/03/2017 0829   GFRNONAA 110 07/03/2017 0829   GFRAA 127 07/03/2017 0829   Lab Results  Component Value Date   CHOL 136 07/03/2017   HDL 30 (L) 07/03/2017   LDLCALC 79 07/03/2017   TRIG 134 07/03/2017   Lab Results  Component Value Date   HGBA1C 6.0 (H) 07/03/2017   Lab Results  Component Value Date   VITAMINB12 499 03/21/2017   Lab Results  Component Value Date   TSH 1.710 03/21/2017      ASSESSMENT AND PLAN 41 y.o. year old male  has a past medical history of Acid reflux; Angina; Back pain; Concussion; Fatty liver; Gallbladder pain; GERD (gastroesophageal reflux disease); Glaucoma; Hearing difficulty; Hiatal hernia; Hyperlipidemia; Hypertension; IBS (irritable bowel syndrome); Joint pain; PONV (postoperative nausea and vomiting); Prediabetes; Seasonal allergies; Shortness of breath; Sleep apnea; SOB (shortness of breath); and TMJ (dislocation of temporomandibular joint). here with:  1. Obstructive sleep apnea on CPAP  Unfortunately we were unable to view a download as the patient did not bring his memory card. I advised the patient that he  should bring his memory card by our office so we can do a download. In the meantime I will send an order to his DME company for a new machine. He is advised that if his symptoms worsen or he develops new symptoms he should let us know. He will follow-up in 6 months or sooner if needed.     Ward Givens, MSN, NP-C 08/29/2017, 2:54 PM [facility] [address], [place], [place] [address] [phone]  I reviewed the above note and documentation by the Nurse Practitioner and agree with the history, physical exam, assessment and plan as outlined above. I was immediately available for face-to-face consultation. Star Age, MD, PhD [facility])

## 2017-09-03 ENCOUNTER — Ambulatory Visit (INDEPENDENT_AMBULATORY_CARE_PROVIDER_SITE_OTHER): Payer: BLUE CROSS/BLUE SHIELD | Admitting: Physician Assistant

## 2017-09-03 VITALS — BP 119/76 | HR 80 | Temp 97.7°F | Ht 64.0 in | Wt 266.0 lb

## 2017-09-03 DIAGNOSIS — E559 Vitamin D deficiency, unspecified: Secondary | ICD-10-CM

## 2017-09-03 DIAGNOSIS — Z6841 Body Mass Index (BMI) 40.0 and over, adult: Secondary | ICD-10-CM

## 2017-09-03 DIAGNOSIS — R7303 Prediabetes: Secondary | ICD-10-CM | POA: Diagnosis not present

## 2017-09-03 MED ORDER — METFORMIN HCL 500 MG PO TABS: 500.0000 mg | ORAL_TABLET | Freq: Two times a day (BID) | ORAL | 0 refills | Status: DC

## 2017-09-04 NOTE — Progress Notes (Signed)
Office: [phone]  /  Fax: [phone]   HPI:   Chief Complaint: OBESITY [name] Hart is here to discuss his progress with his obesity treatment plan. He is on the keep a food journal with 1500 calories and 90+ grams of protein daily and is following his eating plan approximately 50 % of the time. He states he is exercising 0 minutes 0 times per week. [name] Hart has a hard time following the plan and is not planning his meals. He states he eats out a lot, but also states he will try to get back on track. His weight is 266 lb (120.7 kg) today and has had a weight gain of 5 pounds over a period of 4 weeks since his last visit. He has lost 7 lbs since starting treatment with [name].  Vitamin D deficiency [name] Hart has a diagnosis of vitamin D deficiency. He is currently taking vit D and denies nausea, vomiting or muscle weakness.  Pre-Diabetes [name] Hart has a diagnosis of pre-diabetes based on his elevated Hgb A1c and was informed this puts him at greater risk of developing diabetes. He is taking metformin currently and continues to work on diet and exercise to decrease risk of diabetes. He denies nausea, polyphagia or hypoglycemia.   ALLERGIES: Allergies  Allergen Reactions  . Clindamycin Itching and Rash  . Pantoprazole Rash    MEDICATIONS: Current Outpatient Prescriptions on File Prior to Visit  Medication Sig Dispense Refill  . aspirin EC 81 MG tablet Take 81 mg by mouth daily.      [name] atorvastatin (LIPITOR) 10 MG tablet Take 10 mg by mouth daily.      . cetirizine (ZYRTEC) 10 MG tablet Take 10 mg by mouth daily.    . dorzolamidel-timolol (COSOPT PF) 22.3-6.8 MG/ML SOLN ophthalmic solution INSTILL ONE DROP IN [facility] EYE TWICE DAILY    . esomeprazole (NEXIUM) 40 MG capsule Take 40 mg by mouth daily before breakfast.      . lisinopril (PRINIVIL,ZESTRIL) 5 MG tablet 5 mg daily.    . montelukast (SINGULAIR) 10 MG tablet Take 10 mg by mouth at bedtime.    . Omega-3 Fatty Acids (FISH OIL) 1000 MG  CAPS Take 1,000 mg by mouth daily.      . vitamin B-12 (CYANOCOBALAMIN) 100 MCG tablet Take 100 mcg by mouth daily. Unknown dose    . Vitamin D, Ergocalciferol, (DRISDOL) 50000 units CAPS capsule Take 1 capsule (50,000 Units total) by mouth every 7 (seven) days. 4 capsule 0   No current facility-administered medications on file prior to visit.     PAST MEDICAL HISTORY: Past Medical History:  Diagnosis Date  . Acid reflux   . Angina    pain in back lft arm aug 12 echo done in epic  neg  . Back pain   . Concussion    41 yrs old  . Fatty liver   . Gallbladder pain   . GERD (gastroesophageal reflux disease)   . Glaucoma   . Hearing difficulty    high pitch nerve deafness  . Hiatal hernia    sliding hernia  . Hyperlipidemia   . Hypertension   . IBS (irritable bowel syndrome)   . Joint pain   . PONV (postoperative nausea and vomiting)   . Prediabetes   . Seasonal allergies   . Shortness of breath   . Sleep apnea    cpap used  since 06  . SOB (shortness of breath)   . TMJ (dislocation of temporomandibular joint)  PAST SURGICAL HISTORY: Past Surgical History:  Procedure Laterality Date  . CHOLECYSTECTOMY  09  . CHOLESTEATOMA EXCISION  02   x2  lft parotid area  . NASAL SEPTOPLASTY W/ TURBINOPLASTY  11/02/2011   Procedure: NASAL SEPTOPLASTY WITH TURBINATE REDUCTION;  Surgeon: Leonette Most;  Location: [facility] OR;  Service: ENT;  Laterality: Bilateral;  SEPTOPLASTY WITH BILATERAL TURBINATE REDUCTION  . TONSILLECTOMY      SOCIAL HISTORY: Social History  Substance Use Topics  . Smoking status: Never Smoker  . Smokeless tobacco: Never Used  . Alcohol use Yes     Comment: light/occas    FAMILY HISTORY: Family History  Problem Relation Age of Onset  . Cancer Mother   . Hypertension Father   . Diabetes Father   . Hyperlipidemia Father   . Obesity Father     ROS: Review of Systems  Constitutional: Negative for weight loss.  Gastrointestinal: Negative for nausea and  vomiting.  Musculoskeletal:       Negative muscle weakness  Endo/Heme/Allergies:       Negative polyphagia Negative hypoglycemia    PHYSICAL EXAM: Blood pressure 119/76, pulse 80, temperature 97.7 F (36.5 C), temperature source Oral, height  (1.626 m), weight 266 lb (120.7 kg), SpO2 97 %. Body mass index is 45.66 kg/m. Physical Exam  Constitutional: He is oriented to person, place, and time. He appears well-developed and well-nourished.  Cardiovascular: Normal rate.   Pulmonary/Chest: Effort normal.  Musculoskeletal: Normal range of motion.  Neurological: He is oriented to person, place, and time.  Skin: Skin is warm and dry.  Psychiatric: He has a normal mood and affect. His behavior is normal.  Vitals reviewed.   RECENT LABS AND TESTS: BMET    Component Value Date/Time   NA 137 07/03/2017 0829   K 4.2 07/03/2017 0829   CL 99 07/03/2017 0829   CO2 22 07/03/2017 0829   GLUCOSE 93 07/03/2017 0829   GLUCOSE 89 10/30/2011 1600   BUN 14 07/03/2017 0829   CREATININE 0.85 07/03/2017 0829   CALCIUM 9.7 07/03/2017 0829   GFRNONAA 110 07/03/2017 0829   GFRAA 127 07/03/2017 0829   Lab Results  Component Value Date   HGBA1C 6.0 (H) 07/03/2017   HGBA1C 6.2 (H) 03/21/2017   Lab Results  Component Value Date   INSULIN 17.3 07/03/2017   INSULIN 18.8 03/21/2017   CBC    Component Value Date/Time   WBC 6.2 07/03/2017 0829   WBC 8.5 10/30/2011 1600   RBC 4.72 07/03/2017 0829   RBC 4.74 10/30/2011 1600   HGB 13.8 07/03/2017 0829   HCT 40.0 07/03/2017 0829   PLT 294 10/30/2011 1600   MCV 85 07/03/2017 0829   MCH 29.2 07/03/2017 0829   MCH 30.2 10/30/2011 1600   MCHC 34.5 07/03/2017 0829   MCHC 35.2 10/30/2011 1600   RDW 13.2 07/03/2017 0829   LYMPHSABS 1.5 07/03/2017 0829   EOSABS 0.1 07/03/2017 0829   BASOSABS 0.0 07/03/2017 0829   Iron/TIBC/Ferritin/ %Sat No results found for: IRON, TIBC, FERRITIN, IRONPCTSAT Lipid Panel     Component Value Date/Time    CHOL 136 07/03/2017 0829   TRIG 134 07/03/2017 0829   HDL 30 (L) 07/03/2017 0829   LDLCALC 79 07/03/2017 0829   Hepatic Function Panel     Component Value Date/Time   PROT 6.8 07/03/2017 0829   ALBUMIN 4.5 07/03/2017 0829   AST 17 07/03/2017 0829   ALT 21 07/03/2017 0829   ALKPHOS 66 07/03/2017 0829  BILITOT 0.3 07/03/2017 0829      Component Value Date/Time   TSH 1.710 03/21/2017 1057    ASSESSMENT AND PLAN: Prediabetes - Plan: metFORMIN (GLUCOPHAGE) 500 MG tablet  Vitamin D deficiency  Class 3 severe obesity with serious comorbidity and body mass index (BMI) of 45.0 to 49.9 in adult, unspecified obesity type (HCC)  PLAN:  Vitamin D Deficiency [name] Hart was informed that low vitamin D levels contributes to fatigue and are associated with obesity, breast, and colon cancer. He agrees to continue to take prescription Vit D ,000 IU every week and will follow up for routine testing of vitamin D, at least 2-3 times per year. He was informed of the risk of over-replacement of vitamin D and agrees to not increase his dose unless he discusses this with [name] first.  Pre-Diabetes [name] Hart will continue to work on weight loss, exercise, and decreasing simple carbohydrates in his diet to help decrease the risk of diabetes. We dicussed metformin including benefits and risks. He was informed that eating too many simple carbohydrates or too many calories at one sitting increases the likelihood of GI side effects. [name] Hart agrees to continue metformin for now and a prescription was written today for metformin 500 mg bid #60 with no refills. [name] Hart agreed to follow up with [name] as directed to monitor his progress.  Obesity [name] Hart is currently in the action stage of change. As such, his goal is to continue with weight loss efforts He has agreed to keep a food journal with 1500 calories and 90 grams of protein daily [name] Hart has been instructed to work up to a goal of 150 minutes of combined cardio  and strengthening exercise per week for weight loss and overall health benefits. We discussed the following Behavioral Modification Strategies today: increasing lean protein intake and work on meal planning and easy cooking plans  [name] Hart has agreed to follow up with our clinic in 2 to 3 weeks. He was informed of the importance of frequent follow up visits to maximize his success with intensive lifestyle modifications for his multiple health conditions.  I, [place] Crane, am acting as transcriptionist for Illa Level, PA-C  I have reviewed the above documentation for accuracy and completeness, and I agree with the above. -Illa Level, PA-C  I have reviewed the above note and agree with the plan. -Quillian Quince, MD   OBESITY BEHAVIORAL INTERVENTION VISIT  Today's visit was # 10 out of 22.  Starting weight: 272 lbs Starting date: 03/21/17 Today's weight : 266 lbs  Today's date: 09/03/2017 Total lbs lost to date: 7 (Patients must lose 7 lbs in the first 6 months to continue with counseling)   ASK: We discussed the diagnosis of obesity with [name] Hart today and [name] Hart agreed to give [name] permission to discuss obesity behavioral modification therapy today.  ASSESS: Crisanto has the diagnosis of obesity and his BMI today is 45.64 Stepan is in the action stage of change   ADVISE: Sabastion was educated on the multiple health risks of obesity as well as the benefit of weight loss to improve his health. He was advised of the need for long term treatment and the importance of lifestyle modifications.  AGREE: Multiple dietary modification options and treatment options were discussed and  Joseh agreed to keep a food journal with 1500 calories and 90 grams of protein daily We discussed the following Behavioral Modification Strategies today: increasing lean protein intake and work on meal planning and easy cooking plans

## 2017-09-20 DIAGNOSIS — M5441 Lumbago with sciatica, right side: Secondary | ICD-10-CM | POA: Diagnosis not present

## 2017-09-20 DIAGNOSIS — Z302 Encounter for sterilization: Secondary | ICD-10-CM | POA: Diagnosis not present

## 2017-09-20 DIAGNOSIS — G8929 Other chronic pain: Secondary | ICD-10-CM | POA: Diagnosis not present

## 2017-09-23 DIAGNOSIS — Z6841 Body Mass Index (BMI) 40.0 and over, adult: Secondary | ICD-10-CM | POA: Diagnosis not present

## 2017-09-23 DIAGNOSIS — E669 Obesity, unspecified: Secondary | ICD-10-CM | POA: Diagnosis not present

## 2017-09-23 DIAGNOSIS — Z881 Allergy status to other antibiotic agents status: Secondary | ICD-10-CM | POA: Diagnosis not present

## 2017-09-23 DIAGNOSIS — Z7982 Long term (current) use of aspirin: Secondary | ICD-10-CM | POA: Diagnosis not present

## 2017-09-23 DIAGNOSIS — E119 Type 2 diabetes mellitus without complications: Secondary | ICD-10-CM | POA: Diagnosis not present

## 2017-09-23 DIAGNOSIS — H53149 Visual discomfort, unspecified: Secondary | ICD-10-CM | POA: Diagnosis not present

## 2017-09-23 DIAGNOSIS — Z79899 Other long term (current) drug therapy: Secondary | ICD-10-CM | POA: Diagnosis not present

## 2017-09-23 DIAGNOSIS — Z791 Long term (current) use of non-steroidal anti-inflammatories (NSAID): Secondary | ICD-10-CM | POA: Diagnosis not present

## 2017-09-23 DIAGNOSIS — R51 Headache: Secondary | ICD-10-CM | POA: Diagnosis not present

## 2017-09-23 DIAGNOSIS — Z888 Allergy status to other drugs, medicaments and biological substances status: Secondary | ICD-10-CM | POA: Diagnosis not present

## 2017-09-23 DIAGNOSIS — R110 Nausea: Secondary | ICD-10-CM | POA: Diagnosis not present

## 2017-09-23 DIAGNOSIS — K219 Gastro-esophageal reflux disease without esophagitis: Secondary | ICD-10-CM | POA: Diagnosis not present

## 2017-09-23 DIAGNOSIS — Z7984 Long term (current) use of oral hypoglycemic drugs: Secondary | ICD-10-CM | POA: Diagnosis not present

## 2017-09-23 DIAGNOSIS — I10 Essential (primary) hypertension: Secondary | ICD-10-CM | POA: Diagnosis not present

## 2017-09-24 ENCOUNTER — Other Ambulatory Visit: Payer: Self-pay | Admitting: Physical Medicine and Rehabilitation

## 2017-09-24 DIAGNOSIS — R519 Headache, unspecified: Secondary | ICD-10-CM

## 2017-09-24 DIAGNOSIS — R51 Headache: Principal | ICD-10-CM

## 2017-09-24 DIAGNOSIS — T8189XA Other complications of procedures, not elsewhere classified, initial encounter: Secondary | ICD-10-CM

## 2017-09-25 ENCOUNTER — Ambulatory Visit
Admission: RE | Admit: 2017-09-25 | Discharge: 2017-09-25 | Disposition: A | Discharge status: Home / Self Care | Payer: BLUE CROSS/BLUE SHIELD | Source: Ambulatory Visit | Attending: Physical Medicine and Rehabilitation | Admitting: Physical Medicine and Rehabilitation

## 2017-09-25 ENCOUNTER — Ambulatory Visit (INDEPENDENT_AMBULATORY_CARE_PROVIDER_SITE_OTHER): Payer: BLUE CROSS/BLUE SHIELD | Admitting: Physician Assistant

## 2017-09-25 ENCOUNTER — Encounter (INDEPENDENT_AMBULATORY_CARE_PROVIDER_SITE_OTHER): Payer: Self-pay

## 2017-09-25 DIAGNOSIS — G971 Other reaction to spinal and lumbar puncture: Secondary | ICD-10-CM | POA: Diagnosis not present

## 2017-09-25 DIAGNOSIS — R51 Headache: Principal | ICD-10-CM

## 2017-09-25 DIAGNOSIS — T8189XA Other complications of procedures, not elsewhere classified, initial encounter: Secondary | ICD-10-CM

## 2017-09-25 MED ORDER — IOPAMIDOL (ISOVUE-M 200) INJECTION 41%: 1.0000 mL | Freq: Once | INTRAMUSCULAR | Status: DC

## 2017-09-25 NOTE — Progress Notes (Signed)
Discharge instructions explained to pt and his wife. 

## 2017-09-25 NOTE — Discharge Instructions (Signed)

## 2017-09-25 NOTE — Progress Notes (Signed)
20 cc's of blood drawn from left [facility] for blood patch. Site is unremarkable and pt tolerated procedure well. Dr. Charise Killianerry into speak to pt before blood drawn.

## 2017-10-08 DIAGNOSIS — G8929 Other chronic pain: Secondary | ICD-10-CM | POA: Diagnosis not present

## 2017-10-08 DIAGNOSIS — M5441 Lumbago with sciatica, right side: Secondary | ICD-10-CM | POA: Diagnosis not present

## 2017-10-08 DIAGNOSIS — M4150 Other secondary scoliosis, site unspecified: Secondary | ICD-10-CM | POA: Diagnosis not present

## 2017-10-11 ENCOUNTER — Ambulatory Visit (INDEPENDENT_AMBULATORY_CARE_PROVIDER_SITE_OTHER): Payer: BLUE CROSS/BLUE SHIELD | Admitting: Physician Assistant

## 2017-10-11 VITALS — BP 122/83 | HR 88 | Temp 97.7°F | Ht 64.0 in | Wt 264.0 lb

## 2017-10-11 DIAGNOSIS — R7303 Prediabetes: Secondary | ICD-10-CM

## 2017-10-11 DIAGNOSIS — Z6841 Body Mass Index (BMI) 40.0 and over, adult: Secondary | ICD-10-CM | POA: Diagnosis not present

## 2017-10-11 DIAGNOSIS — E559 Vitamin D deficiency, unspecified: Secondary | ICD-10-CM

## 2017-10-11 MED ORDER — METFORMIN HCL 500 MG PO TABS: 500.0000 mg | ORAL_TABLET | Freq: Two times a day (BID) | ORAL | 0 refills | Status: DC

## 2017-10-11 NOTE — Progress Notes (Signed)
Office: [phone]  /  Fax: [phone]   HPI:   Chief Complaint: OBESITY [name] Hart is here to discuss his progress with his obesity treatment plan. He is on the keep a food journal with 1500 calories and 90 grams of protein daily and is following his eating plan approximately 20 % of the time. He states he is exercising 0 minutes 0 times per week. [name] Hart has been better at planning his meals. He continues to eat out. [name] Hart is motivated to be more mindful and continue weight loss. His weight is 264 lb (119.7 kg) today and has had a weight loss of 2 pounds over a period of 5 weeks since his last visit. He has lost 18 lbs since starting treatment with [name].  Pre-Diabetes [name] Hart has a diagnosis of pre-diabetes based on his elevated Hgb A1c and was informed this puts him at greater risk of developing diabetes. He is taking metformin currently and continues to work on diet and exercise to decrease risk of diabetes. He denies nausea, polyphagia or hypoglycemia.  Vitamin D deficiency [name] Hart has a diagnosis of vitamin D deficiency. He is currently taking vit D and denies nausea, vomiting or muscle weakness.  ALLERGIES: Allergies  Allergen Reactions  . Clindamycin Itching and Rash  . Pantoprazole Rash    MEDICATIONS: Current Outpatient Medications on File Prior to Visit  Medication Sig Dispense Refill  . aspirin EC 81 MG tablet Take 81 mg by mouth daily.      [name] atorvastatin (LIPITOR) 10 MG tablet Take 10 mg by mouth daily.      . cetirizine (ZYRTEC) 10 MG tablet Take 10 mg by mouth daily.    . dorzolamidel-timolol (COSOPT PF) 22.3-6.8 MG/ML SOLN ophthalmic solution INSTILL ONE DROP IN [facility] EYE TWICE DAILY    . esomeprazole (NEXIUM) 40 MG capsule Take 40 mg by mouth daily before breakfast.      . lisinopril (PRINIVIL,ZESTRIL) 5 MG tablet 5 mg daily.    . metFORMIN (GLUCOPHAGE) 500 MG tablet Take 1 tablet (500 mg total) by mouth 2 (two) times daily. 60 tablet 0  . montelukast  (SINGULAIR) 10 MG tablet Take 10 mg by mouth at bedtime.    . Omega-3 Fatty Acids (FISH OIL) 1000 MG CAPS Take 1,000 mg by mouth daily.      . vitamin B-12 (CYANOCOBALAMIN) 100 MCG tablet Take 100 mcg by mouth daily. Unknown dose    . Vitamin D, Ergocalciferol, (DRISDOL) 50000 units CAPS capsule Take 1 capsule (50,000 Units total) by mouth every 7 (seven) days. 4 capsule 0   No current facility-administered medications on file prior to visit.     PAST MEDICAL HISTORY: Past Medical History:  Diagnosis Date  . Acid reflux   . Angina    pain in back lft arm aug 12 echo done in epic  neg  . Back pain   . Concussion    41 yrs old  . Fatty liver   . Gallbladder pain   . GERD (gastroesophageal reflux disease)   . Glaucoma   . Hearing difficulty    high pitch nerve deafness  . Hiatal hernia    sliding hernia  . Hyperlipidemia   . Hypertension   . IBS (irritable bowel syndrome)   . Joint pain   . PONV (postoperative nausea and vomiting)   . Prediabetes   . Seasonal allergies   . Shortness of breath   . Sleep apnea    cpap used  since 06  . SOB (  shortness of breath)   . TMJ (dislocation of temporomandibular joint)     PAST SURGICAL HISTORY: Past Surgical History:  Procedure Laterality Date  . CHOLECYSTECTOMY  09  . CHOLESTEATOMA EXCISION  02   x2  lft parotid area  . TONSILLECTOMY      SOCIAL HISTORY: Social History   Tobacco Use  . Smoking status: Never Smoker  . Smokeless tobacco: Never Used  Substance Use Topics  . Alcohol use: Yes    Comment: light/occas  . Drug use: No    FAMILY HISTORY: Family History  Problem Relation Age of Onset  . Cancer Mother   . Hypertension Father   . Diabetes Father   . Hyperlipidemia Father   . Obesity Father     ROS: Review of Systems  Constitutional: Positive for weight loss.  Gastrointestinal: Negative for nausea and vomiting.  Musculoskeletal:       Negative muscle weakness  Endo/Heme/Allergies:       Negative  polyphagia Negative hypoglycemia    PHYSICAL EXAM: Blood pressure 122/83, pulse 88, temperature 97.7 F (36.5 C), temperature source Oral, height 5\' 4"  (1.626 m), weight 264 lb (119.7 kg), SpO2 98 %. Body mass index is 45.32 kg/m. Physical Exam  Constitutional: He is oriented to person, place, and time. He appears well-developed and well-nourished.  Cardiovascular: Normal rate.  Pulmonary/Chest: Effort normal.  Musculoskeletal: Normal range of motion.  Neurological: He is alert and oriented to person, place, and time.  Skin: Skin is warm.  Psychiatric: He has a normal mood and affect.    RECENT LABS AND TESTS: BMET    Component Value Date/Time   NA 137 07/03/2017 0829   K 4.2 07/03/2017 0829   CL 99 07/03/2017 0829   CO2 22 07/03/2017 0829   GLUCOSE 93 07/03/2017 0829   GLUCOSE 89 10/30/2011 1600   BUN 14 07/03/2017 0829   CREATININE 0.85 07/03/2017 0829   CALCIUM 9.7 07/03/2017 0829   GFRNONAA 110 07/03/2017 0829   GFRAA 127 07/03/2017 0829   Lab Results  Component Value Date   HGBA1C 6.0 (H) 07/03/2017   HGBA1C 6.2 (H) 03/21/2017   Lab Results  Component Value Date   INSULIN 17.3 07/03/2017   INSULIN 18.8 03/21/2017   CBC    Component Value Date/Time   WBC 6.2 07/03/2017 0829   WBC 8.5 10/30/2011 1600   RBC 4.72 07/03/2017 0829   RBC 4.74 10/30/2011 1600   HGB 13.8 07/03/2017 0829   HCT 40.0 07/03/2017 0829   PLT 294 10/30/2011 1600   MCV 85 07/03/2017 0829   MCH 29.2 07/03/2017 0829   MCH 30.2 10/30/2011 1600   MCHC 34.5 07/03/2017 0829   MCHC 35.2 10/30/2011 1600   RDW 13.2 07/03/2017 0829   LYMPHSABS 1.5 07/03/2017 0829   EOSABS 0.1 07/03/2017 0829   BASOSABS 0.0 07/03/2017 0829   Iron/TIBC/Ferritin/ %Sat No results found for: IRON, TIBC, FERRITIN, IRONPCTSAT Lipid Panel     Component Value Date/Time   CHOL 136 07/03/2017 0829   TRIG 134 07/03/2017 0829   HDL 30 (L) 07/03/2017 0829   LDLCALC 79 07/03/2017 0829   Hepatic Function Panel      Component Value Date/Time   PROT 6.8 07/03/2017 0829   ALBUMIN 4.5 07/03/2017 0829   AST 17 07/03/2017 0829   ALT 21 07/03/2017 0829   ALKPHOS 66 07/03/2017 0829   BILITOT 0.3 07/03/2017 0829      Component Value Date/Time   TSH 1.710 03/21/2017 1057  ASSESSMENT AND PLAN: Prediabetes - Plan: metFORMIN (GLUCOPHAGE) 500 MG tablet  Vitamin D deficiency  Class 3 severe obesity with serious comorbidity and body mass index (BMI) of 45.0 to 49.9 in adult, unspecified obesity type (HCC)  PLAN:  Pre-Diabetes [name] Hart will continue to work on weight loss, exercise, and decreasing simple carbohydrates in his diet to help decrease the risk of diabetes. We dicussed metformin including benefits and risks. He was informed that eating too many simple carbohydrates or too many calories at one sitting increases the likelihood of GI side effects. [name] Hart requested metformin for now and a prescription was written today for metformin 500 mg bid #60 with no refills. [name] Hart agreed to follow up with us as directed to monitor his progress.  Vitamin D Deficiency [name] Hart was informed that low vitamin D levels contributes to fatigue and are associated with obesity, breast, and colon cancer. He agrees to continue to take prescription Vit D @50 ,000 IU every week and will follow up for routine testing of vitamin D, at least 2-3 times per year. He was informed of the risk of over-replacement of vitamin D and agrees to not increase his dose unless he discusses this with us first.  Obesity [name] Hart is currently in the action stage of change. As such, his goal is to continue with weight loss efforts He has agreed to keep a food journal with 1500 calories and 90 grams of protein daily [name] Hart has been instructed to work up to a goal of 150 minutes of combined cardio and strengthening exercise per week for weight loss and overall health benefits. We discussed the following Behavioral Modification Strategies today:  increasing lean protein intake and keeping healthy foods in the home  [name] Hart has agreed to follow up with our clinic in 3 weeks. He was informed of the importance of frequent follow up visits to maximize his success with intensive lifestyle modifications for his multiple health conditions.  I, [place] CraneJoanne Murray, am acting as transcriptionist for Illa LevelSahar Osman, PA-C  I have reviewed the above documentation for accuracy and completeness, and I agree with the above. -Illa LevelSahar Osman, PA-C  I have reviewed the above note and agree with the plan. -Quillian Quincearen Beasley, MD   OBESITY BEHAVIORAL INTERVENTION VISIT  Today's visit was # 11 out of 22.  Starting weight: 272 lbs Starting date: 03/21/17 Today's weight : 254 lbs Today's date: 10/11/2017 Total lbs lost to date: 5518 (Patients must lose 7 lbs in the first 6 months to continue with counseling)   ASK: We discussed the diagnosis of obesity with [name] Hart today and [name] HartPatrick agreed to give us permission to discuss obesity behavioral modification therapy today.  ASSESS: [name] Hart has the diagnosis of obesity and his BMI today is 45.29 [name] Hart is in the action stage of change   ADVISE: [name] Hart was educated on the multiple health risks of obesity as well as the benefit of weight loss to improve his health. He was advised of the need for long term treatment and the importance of lifestyle modifications.  AGREE: Multiple dietary modification options and treatment options were discussed and  [name] Hart agreed to keep a food journal with 1500 calories and 90 grams of protein  We discussed the following Behavioral Modification Strategies today: increasing lean protein intake and keeping healthy foods in the home

## 2017-10-19 DIAGNOSIS — Z302 Encounter for sterilization: Secondary | ICD-10-CM | POA: Diagnosis not present

## 2017-10-30 ENCOUNTER — Encounter (INDEPENDENT_AMBULATORY_CARE_PROVIDER_SITE_OTHER): Payer: Self-pay | Admitting: Physician Assistant

## 2017-10-30 ENCOUNTER — Ambulatory Visit (INDEPENDENT_AMBULATORY_CARE_PROVIDER_SITE_OTHER): Payer: BLUE CROSS/BLUE SHIELD | Admitting: Physician Assistant

## 2017-10-30 VITALS — BP 118/81 | HR 96 | Temp 98.1°F | Ht 64.0 in | Wt 268.0 lb

## 2017-10-30 DIAGNOSIS — E559 Vitamin D deficiency, unspecified: Secondary | ICD-10-CM

## 2017-10-30 DIAGNOSIS — R7303 Prediabetes: Secondary | ICD-10-CM

## 2017-10-30 DIAGNOSIS — Z6841 Body Mass Index (BMI) 40.0 and over, adult: Secondary | ICD-10-CM | POA: Diagnosis not present

## 2017-10-30 MED ORDER — METFORMIN HCL 500 MG PO TABS: 500.0000 mg | ORAL_TABLET | Freq: Two times a day (BID) | ORAL | 0 refills | Status: DC

## 2017-10-30 NOTE — Progress Notes (Signed)
Office: [phone]  /  Fax: [phone]   HPI:   Chief Complaint: OBESITY [name] Hart is here to discuss his progress with his obesity treatment plan. He is on the keep a food journal with 1500 calories and 90 grams of protein daily and is following his eating plan approximately 30 % of the time. He states he is exercising 0 minutes 0 times per week. [name] Hart has not been following the plan and he states due to his chronic pain, he has been having increased emotional eating. He states he has a hard time planning ahead his meals and would like more meal planning ideas. His weight is 268 lb (121.6 kg) today and has gained 4 pounds since his last visit. He has lost 4 lbs since starting treatment with [name].  Pre-Diabetes [name] Hart has a diagnosis of pre-diabetes based on his elevated Hgb A1c and was informed this puts him at greater risk of developing diabetes. He is taking metformin currently and continues to work on diet and exercise to decrease risk of diabetes. He denies nausea, polyphagia, or hypoglycemia.  Vitamin D deficiency [name] Hart has a diagnosis of vitamin D deficiency. He is currently taking prescription Vit D and denies nausea, vomiting or muscle weakness.  ALLERGIES: Allergies  Allergen Reactions  . Clindamycin Itching and Rash  . Pantoprazole Rash    MEDICATIONS: Current Outpatient Medications on File Prior to Visit  Medication Sig Dispense Refill  . aspirin EC 81 MG tablet Take 81 mg by mouth daily.      [name] atorvastatin (LIPITOR) 10 MG tablet Take 10 mg by mouth daily.      . cetirizine (ZYRTEC) 10 MG tablet Take 10 mg by mouth daily.    . dorzolamidel-timolol (COSOPT PF) 22.3-6.8 MG/ML SOLN ophthalmic solution INSTILL ONE DROP IN [facility] EYE TWICE DAILY    . esomeprazole (NEXIUM) 40 MG capsule Take 40 mg by mouth daily before breakfast.      . lisinopril (PRINIVIL,ZESTRIL) 5 MG tablet 5 mg daily.    . metFORMIN (GLUCOPHAGE) 500 MG tablet Take 1 tablet (500 mg total) 2 (two)  times daily by mouth. 60 tablet 0  . montelukast (SINGULAIR) 10 MG tablet Take 10 mg by mouth at bedtime.    . Omega-3 Fatty Acids (FISH OIL) 1000 MG CAPS Take 1,000 mg by mouth daily.      . vitamin B-12 (CYANOCOBALAMIN) 100 MCG tablet Take 100 mcg by mouth daily. Unknown dose    . Vitamin D, Ergocalciferol, (DRISDOL) 50000 units CAPS capsule Take 1 capsule (50,000 Units total) by mouth every 7 (seven) days. 4 capsule 0   No current facility-administered medications on file prior to visit.     PAST MEDICAL HISTORY: Past Medical History:  Diagnosis Date  . Acid reflux   . Angina    pain in back lft arm aug 12 echo done in epic  neg  . Back pain   . Concussion    41 yrs old  . Fatty liver   . Gallbladder pain   . GERD (gastroesophageal reflux disease)   . Glaucoma   . Hearing difficulty    high pitch nerve deafness  . Hiatal hernia    sliding hernia  . Hyperlipidemia   . Hypertension   . IBS (irritable bowel syndrome)   . Joint pain   . PONV (postoperative nausea and vomiting)   . Prediabetes   . Seasonal allergies   . Shortness of breath   . Sleep apnea    cpap  used  since 06  . SOB (shortness of breath)   . TMJ (dislocation of temporomandibular joint)     PAST SURGICAL HISTORY: Past Surgical History:  Procedure Laterality Date  . CHOLECYSTECTOMY  09  . CHOLESTEATOMA EXCISION  02   x2  lft parotid area  . NASAL SEPTOPLASTY W/ TURBINOPLASTY  11/02/2011   Procedure: NASAL SEPTOPLASTY WITH TURBINATE REDUCTION;  Surgeon: Leonette MostJohn M Byers;  Location: [facility] OR;  Service: ENT;  Laterality: Bilateral;  SEPTOPLASTY WITH BILATERAL TURBINATE REDUCTION  . TONSILLECTOMY      SOCIAL HISTORY: Social History   Tobacco Use  . Smoking status: Never Smoker  . Smokeless tobacco: Never Used  Substance Use Topics  . Alcohol use: Yes    Comment: light/occas  . Drug use: No    FAMILY HISTORY: Family History  Problem Relation Age of Onset  . Cancer Mother   . Hypertension Father     . Diabetes Father   . Hyperlipidemia Father   . Obesity Father     ROS: Review of Systems  Constitutional: Negative for weight loss.  Gastrointestinal: Negative for nausea and vomiting.  Musculoskeletal:       Negative muscle weakness  Endo/Heme/Allergies:       Negative polyphagia Negative hypoglycemia    PHYSICAL EXAM: Blood pressure 118/81, pulse 96, temperature 98.1 F (36.7 C), temperature source Oral, height 5\' 4"  (1.626 m), weight 268 lb (121.6 kg), SpO2 99 %. Body mass index is 46 kg/m. Physical Exam  Constitutional: He is oriented to person, place, and time. He appears well-developed and well-nourished.  Cardiovascular: Normal rate.  Pulmonary/Chest: Effort normal.  Musculoskeletal: Normal range of motion.  Neurological: He is oriented to person, place, and time.  Skin: Skin is warm and dry.  Psychiatric: He has a normal mood and affect. His behavior is normal.  Vitals reviewed.   RECENT LABS AND TESTS: BMET    Component Value Date/Time   NA 137 07/03/2017 0829   K 4.2 07/03/2017 0829   CL 99 07/03/2017 0829   CO2 22 07/03/2017 0829   GLUCOSE 93 07/03/2017 0829   GLUCOSE 89 10/30/2011 1600   BUN 14 07/03/2017 0829   CREATININE 0.85 07/03/2017 0829   CALCIUM 9.7 07/03/2017 0829   GFRNONAA 110 07/03/2017 0829   GFRAA 127 07/03/2017 0829   Lab Results  Component Value Date   HGBA1C 6.0 (H) 07/03/2017   HGBA1C 6.2 (H) 03/21/2017   Lab Results  Component Value Date   INSULIN 17.3 07/03/2017   INSULIN 18.8 03/21/2017   CBC    Component Value Date/Time   WBC 6.2 07/03/2017 0829   WBC 8.5 10/30/2011 1600   RBC 4.72 07/03/2017 0829   RBC 4.74 10/30/2011 1600   HGB 13.8 07/03/2017 0829   HCT 40.0 07/03/2017 0829   PLT 294 10/30/2011 1600   MCV 85 07/03/2017 0829   MCH 29.2 07/03/2017 0829   MCH 30.2 10/30/2011 1600   MCHC 34.5 07/03/2017 0829   MCHC 35.2 10/30/2011 1600   RDW 13.2 07/03/2017 0829   LYMPHSABS 1.5 07/03/2017 0829   EOSABS 0.1  07/03/2017 0829   BASOSABS 0.0 07/03/2017 0829   Iron/TIBC/Ferritin/ %Sat No results found for: IRON, TIBC, FERRITIN, IRONPCTSAT Lipid Panel     Component Value Date/Time   CHOL 136 07/03/2017 0829   TRIG 134 07/03/2017 0829   HDL 30 (L) 07/03/2017 0829   LDLCALC 79 07/03/2017 0829   Hepatic Function Panel     Component Value Date/Time  PROT 6.8 07/03/2017 0829   ALBUMIN 4.5 07/03/2017 0829   AST 17 07/03/2017 0829   ALT 21 07/03/2017 0829   ALKPHOS 66 07/03/2017 0829   BILITOT 0.3 07/03/2017 0829      Component Value Date/Time   TSH 1.710 03/21/2017 1057    ASSESSMENT AND PLAN: Prediabetes - Plan: metFORMIN (GLUCOPHAGE) 500 MG tablet  Vitamin D deficiency  Class 3 severe obesity with serious comorbidity and body mass index (BMI) of 45.0 to 49.9 in adult, unspecified obesity type (HCC)  PLAN:  Pre-Diabetes [name] Hart will continue to work on weight loss, exercise, and decreasing simple carbohydrates in his diet to help decrease the risk of diabetes. We dicussed metformin including benefits and risks. He was informed that eating too many simple carbohydrates or too many calories at one sitting increases the likelihood of GI side effects. [name] Hart agrees to continue taking metformin 500 mg BID #60 and we will refill for 1 month. [name] Hart agrees to follow up with our clinic in 2 to 3 weeks as directed to monitor his progress.  Vitamin D Deficiency [name] Hart was informed that low vitamin D levels contributes to fatigue and are associated with obesity, breast, and colon cancer. [name] Hart agrees to continue taking prescription Vit D @50 ,000 IU every week #4 and he will follow up for routine testing of vitamin D, at least 2-3 times per year. He was informed of the risk of over-replacement of vitamin D and agrees to not increase his dose unless he discusses this with us first. [name] Hart agrees to follow up with our clinic in 2 to 3 weeks.  Obesity [name] Hart is currently in the action stage of  change. As such, his goal is to continue with weight loss efforts He has agreed to keep a food journal with 1500 calories and 90 grams of protein daily [name] Hart has been instructed to work up to a goal of 150 minutes of combined cardio and strengthening exercise per week for weight loss and overall health benefits. We discussed the following Behavioral Modification Strategies today: increasing lean protein intake and work on meal planning and easy cooking plans    [name] Hart has agreed to follow up with our clinic in 2 to 3 weeks. He was informed of the importance of frequent follow up visits to maximize his success with intensive lifestyle modifications for his multiple health conditions.  I, Burt KnackSharon Dariona Hart, am acting as transcriptionist for Illa LevelSahar Osman, PA-C  I have reviewed the above documentation for accuracy and completeness, and I agree with the above. -Illa LevelSahar Osman, PA-C  I have reviewed the above note and agree with the plan. -Quillian Quincearen Beasley, MD     Today's visit was # 12 out of 22.  Starting weight: 272 lbs Starting date: 03/21/17 Today's weight : 268 lbs  Today's date: 10/30/2017 Total lbs lost to date: 4 (Patients must lose 7 lbs in the first 6 months to continue with counseling)   ASK: We discussed the diagnosis of obesity with [name] GaribaldiPatrick L Koos today and [name] HartPatrick agreed to give us permission to discuss obesity behavioral modification therapy today.  ASSESS: [name] Hart has the diagnosis of obesity and his BMI today is 45.98 [name] Hart is in the action stage of change   ADVISE: [name] Hart was educated on the multiple health risks of obesity as well as the benefit of weight loss to improve his health. He was advised of the need for long term treatment and the importance of lifestyle modifications.  AGREE: Multiple dietary modification options and treatment options  were discussed and  Treydon agreed to keep a food journal with 1500 calories and 90 grams of protein daily We discussed the  following Behavioral Modification Strategies today: increasing lean protein intake and work on meal planning and easy cooking plans

## 2017-11-19 ENCOUNTER — Ambulatory Visit (INDEPENDENT_AMBULATORY_CARE_PROVIDER_SITE_OTHER): Payer: BLUE CROSS/BLUE SHIELD | Admitting: Physician Assistant

## 2017-11-19 VITALS — BP 120/77 | HR 101 | Temp 98.0°F | Ht 64.0 in | Wt 267.0 lb

## 2017-11-19 DIAGNOSIS — I10 Essential (primary) hypertension: Secondary | ICD-10-CM

## 2017-11-19 DIAGNOSIS — Z6841 Body Mass Index (BMI) 40.0 and over, adult: Secondary | ICD-10-CM

## 2017-11-19 NOTE — Progress Notes (Signed)
Office: [phone]  /  Fax: [phone]   HPI:   Chief Complaint: OBESITY [name] Hart is here to discuss his progress with his obesity treatment plan. He is on the keep a food journal with 1500 calories and 90 grams of protein daily and is following his eating plan approximately 30-40 % of the time. He states he is exercising 0 minutes 0 times per week. [name] Hart continues to do well with weight loss. He is more mindful of his eating but continues to struggle with planning ahead his meals. His weight is 267 lb (121.1 kg) today and has had a weight loss of 1 pound over a period of 3 weeks since his last visit. He has lost 5 lbs since starting treatment with [name].  Hypertension [name] Hart is a 41 y.o. male with hypertension. [name] Hart's blood pressure is stable and he denies chest pain or shortness of breath. He is working weight loss to help control his blood pressure with the goal of decreasing his risk of heart attack and stroke. [name] Hart's blood pressure is currently controlled.  ALLERGIES: Allergies  Allergen Reactions  . Clindamycin Itching and Rash  . Pantoprazole Rash    MEDICATIONS: Current Outpatient Medications on File Prior to Visit  Medication Sig Dispense Refill  . aspirin EC 81 MG tablet Take 81 mg by mouth daily.      [name] atorvastatin (LIPITOR) 10 MG tablet Take 10 mg by mouth daily.      . cetirizine (ZYRTEC) 10 MG tablet Take 10 mg by mouth daily.    . dorzolamidel-timolol (COSOPT PF) 22.3-6.8 MG/ML SOLN ophthalmic solution INSTILL ONE DROP IN [facility] EYE TWICE DAILY    . esomeprazole (NEXIUM) 40 MG capsule Take 40 mg by mouth daily before breakfast.      . lisinopril (PRINIVIL,ZESTRIL) 5 MG tablet 5 mg daily.    . metFORMIN (GLUCOPHAGE) 500 MG tablet Take 1 tablet (500 mg total) by mouth 2 (two) times daily. 60 tablet 0  . montelukast (SINGULAIR) 10 MG tablet Take 10 mg by mouth at bedtime.    . Omega-3 Fatty Acids (FISH OIL) 1000 MG CAPS Take 1,000 mg by mouth daily.       . vitamin B-12 (CYANOCOBALAMIN) 100 MCG tablet Take 100 mcg by mouth daily. Unknown dose    . Vitamin D, Ergocalciferol, (DRISDOL) 50000 units CAPS capsule Take 1 capsule (50,000 Units total) by mouth every 7 (seven) days. 4 capsule 0   No current facility-administered medications on file prior to visit.     PAST MEDICAL HISTORY: Past Medical History:  Diagnosis Date  . Acid reflux   . Angina    pain in back lft arm aug 12 echo done in epic  neg  . Back pain   . Concussion    41 yrs old  . Fatty liver   . Gallbladder pain   . GERD (gastroesophageal reflux disease)   . Glaucoma   . Hearing difficulty    high pitch nerve deafness  . Hiatal hernia    sliding hernia  . Hyperlipidemia   . Hypertension   . IBS (irritable bowel syndrome)   . Joint pain   . PONV (postoperative nausea and vomiting)   . Prediabetes   . Seasonal allergies   . Shortness of breath   . Sleep apnea    cpap used  since 06  . SOB (shortness of breath)   . TMJ (dislocation of temporomandibular joint)     PAST SURGICAL HISTORY: Past Surgical  History:  Procedure Laterality Date  . CHOLECYSTECTOMY  09  . CHOLESTEATOMA EXCISION  02   x2  lft parotid area  . NASAL SEPTOPLASTY W/ TURBINOPLASTY  11/02/2011   Procedure: NASAL SEPTOPLASTY WITH TURBINATE REDUCTION;  Surgeon: Leonette MostJohn M Byers;  Location: [facility] OR;  Service: ENT;  Laterality: Bilateral;  SEPTOPLASTY WITH BILATERAL TURBINATE REDUCTION  . TONSILLECTOMY      SOCIAL HISTORY: Social History   Tobacco Use  . Smoking status: Never Smoker  . Smokeless tobacco: Never Used  Substance Use Topics  . Alcohol use: Yes    Comment: light/occas  . Drug use: No    FAMILY HISTORY: Family History  Problem Relation Age of Onset  . Cancer Mother   . Hypertension Father   . Diabetes Father   . Hyperlipidemia Father   . Obesity Father     ROS: Review of Systems  Constitutional: Positive for weight loss.  Respiratory: Negative for shortness of breath.     Cardiovascular: Negative for chest pain.    PHYSICAL EXAM: Blood pressure 120/77, pulse (!) 101, temperature 98 F (36.7 C), temperature source Oral, height 5\' 4"  (1.626 m), weight 267 lb (121.1 kg), SpO2 97 %. Body mass index is 45.83 kg/m. Physical Exam  Constitutional: He is oriented to person, place, and time. He appears well-developed and well-nourished.  Cardiovascular:  tachycardic  Pulmonary/Chest: Effort normal.  Musculoskeletal: Normal range of motion.  Neurological: He is oriented to person, place, and time.  Skin: Skin is warm and dry.  Psychiatric: He has a normal mood and affect. His behavior is normal.  Vitals reviewed.   RECENT LABS AND TESTS: BMET    Component Value Date/Time   NA 137 07/03/2017 0829   K 4.2 07/03/2017 0829   CL 99 07/03/2017 0829   CO2 22 07/03/2017 0829   GLUCOSE 93 07/03/2017 0829   GLUCOSE 89 10/30/2011 1600   BUN 14 07/03/2017 0829   CREATININE 0.85 07/03/2017 0829   CALCIUM 9.7 07/03/2017 0829   GFRNONAA 110 07/03/2017 0829   GFRAA 127 07/03/2017 0829   Lab Results  Component Value Date   HGBA1C 6.0 (H) 07/03/2017   HGBA1C 6.2 (H) 03/21/2017   Lab Results  Component Value Date   INSULIN 17.3 07/03/2017   INSULIN 18.8 03/21/2017   CBC    Component Value Date/Time   WBC 6.2 07/03/2017 0829   WBC 8.5 10/30/2011 1600   RBC 4.72 07/03/2017 0829   RBC 4.74 10/30/2011 1600   HGB 13.8 07/03/2017 0829   HCT 40.0 07/03/2017 0829   PLT 294 10/30/2011 1600   MCV 85 07/03/2017 0829   MCH 29.2 07/03/2017 0829   MCH 30.2 10/30/2011 1600   MCHC 34.5 07/03/2017 0829   MCHC 35.2 10/30/2011 1600   RDW 13.2 07/03/2017 0829   LYMPHSABS 1.5 07/03/2017 0829   EOSABS 0.1 07/03/2017 0829   BASOSABS 0.0 07/03/2017 0829   Iron/TIBC/Ferritin/ %Sat No results found for: IRON, TIBC, FERRITIN, IRONPCTSAT Lipid Panel     Component Value Date/Time   CHOL 136 07/03/2017 0829   TRIG 134 07/03/2017 0829   HDL 30 (L) 07/03/2017 0829    LDLCALC 79 07/03/2017 0829   Hepatic Function Panel     Component Value Date/Time   PROT 6.8 07/03/2017 0829   ALBUMIN 4.5 07/03/2017 0829   AST 17 07/03/2017 0829   ALT 21 07/03/2017 0829   ALKPHOS 66 07/03/2017 0829   BILITOT 0.3 07/03/2017 0829      Component Value  Date/Time   TSH 1.710 03/21/2017 1057    ASSESSMENT AND PLAN: Essential hypertension  Class 3 severe obesity with serious comorbidity and body mass index (BMI) of 45.0 to 49.9 in adult, unspecified obesity type (HCC)  PLAN:  Hypertension We discussed sodium restriction, working on healthy weight loss, and a regular exercise program as the means to achieve improved blood pressure control. [name] Hart agreed with this plan and agreed to follow up as directed. We will continue to monitor his blood pressure as well as his progress with the above lifestyle modifications. He will continue his medications as prescribed and will watch for signs of hypotension as he continues his lifestyle modifications. [name] Hart agrees to follow up with our clinic in 3 weeks.  We spent > than 50% of the 15 minute visit on the counseling as documented in the note.  Obesity [name] Hart is currently in the action stage of change. As such, his goal is to continue with weight loss efforts He has agreed to keep a food journal with 1500 calories and 90 grams of protein daily [name] Hart has been instructed to work up to a goal of 150 minutes of combined cardio and strengthening exercise per week for weight loss and overall health benefits. We discussed the following Behavioral Modification Strategies today: increasing lean protein intake and work on meal planning and easy cooking plans   [name] Hart has agreed to follow up with our clinic in 3 weeks. He was informed of the importance of frequent follow up visits to maximize his success with intensive lifestyle modifications for his multiple health conditions.  I, Burt KnackSharon Martin, am acting as transcriptionist for  Illa LevelSahar Shawntavia Saunders, PA-C  I have reviewed the above documentation for accuracy and completeness, and I agree with the above. -Illa LevelSahar Sumie Remsen, PA-C  I have reviewed the above note and agree with the plan. -Quillian Quincearen Beasley, MD     Today's visit was # 13 out of 22.  Starting weight: 272 lbs Starting date: 03/21/17 Today's weight : 267 lbs  Today's date: 11/19/2017 Total lbs lost to date: 5 (Patients must lose 7 lbs in the first 6 months to continue with counseling)   ASK: We discussed the diagnosis of obesity with [name] GaribaldiPatrick L Hart today and [name] Hart agreed to give us permission to discuss obesity behavioral modification therapy today.  ASSESS: [name] Hart has the diagnosis of obesity and his BMI today is 45.81 [name] Hart is in the action stage of change   ADVISE: [name] Hart was educated on the multiple health risks of obesity as well as the benefit of weight loss to improve his health. He was advised of the need for long term treatment and the importance of lifestyle modifications.  AGREE: Multiple dietary modification options and treatment options were discussed and  [name] Hart agreed to keep a food journal with 1500 calories and 90 grams of protein daily We discussed the following Behavioral Modification Strategies today: increasing lean protein intake and work on meal planning and easy cooking plans

## 2017-11-30 DIAGNOSIS — E119 Type 2 diabetes mellitus without complications: Secondary | ICD-10-CM | POA: Diagnosis not present

## 2017-11-30 DIAGNOSIS — Z7984 Long term (current) use of oral hypoglycemic drugs: Secondary | ICD-10-CM | POA: Diagnosis not present

## 2017-11-30 DIAGNOSIS — H40233 Intermittent angle-closure glaucoma, bilateral: Secondary | ICD-10-CM | POA: Diagnosis not present

## 2017-12-12 ENCOUNTER — Ambulatory Visit (INDEPENDENT_AMBULATORY_CARE_PROVIDER_SITE_OTHER): Payer: BLUE CROSS/BLUE SHIELD | Admitting: Physician Assistant

## 2017-12-18 ENCOUNTER — Ambulatory Visit (INDEPENDENT_AMBULATORY_CARE_PROVIDER_SITE_OTHER): Payer: BLUE CROSS/BLUE SHIELD | Admitting: Physician Assistant

## 2017-12-19 ENCOUNTER — Ambulatory Visit (INDEPENDENT_AMBULATORY_CARE_PROVIDER_SITE_OTHER): Payer: BLUE CROSS/BLUE SHIELD | Admitting: Physician Assistant

## 2017-12-19 VITALS — BP 124/85 | HR 95 | Temp 98.1°F | Ht 64.0 in | Wt 267.0 lb

## 2017-12-19 DIAGNOSIS — R7303 Prediabetes: Secondary | ICD-10-CM

## 2017-12-19 DIAGNOSIS — E559 Vitamin D deficiency, unspecified: Secondary | ICD-10-CM | POA: Diagnosis not present

## 2017-12-19 DIAGNOSIS — Z9189 Other specified personal risk factors, not elsewhere classified: Secondary | ICD-10-CM

## 2017-12-19 DIAGNOSIS — Z6841 Body Mass Index (BMI) 40.0 and over, adult: Secondary | ICD-10-CM

## 2017-12-19 MED ORDER — METFORMIN HCL 500 MG PO TABS: 500.0000 mg | ORAL_TABLET | Freq: Two times a day (BID) | ORAL | 0 refills | Status: DC

## 2017-12-19 NOTE — Progress Notes (Addendum)
Office: [phone]  /  Fax: [phone]   HPI:   Chief Complaint: OBESITY [name] Hart is here to discuss his progress with his obesity treatment plan. He is on the keep a food journal with 1500 calories and 90 grams of protein daily and is following his eating plan approximately 50 % of the time. He states he is exercising 0 minutes 0 times per week. [name] Hart maintained his weight. He is not following the plan and eats out most meals. Patient states he would like to get back to weight loss, however, he admits he has not been planning ahead his meals and isnt staying on track as she should.  His weight is 267 lb (121.1 kg) today and has not lost weight since his last visit. He has lost 5 lbs since starting treatment with [name].  Pre-Diabetes [name] Hart has a diagnosis of pre-diabetes based on his elevated Hgb A1c and was informed this puts him at greater risk of developing diabetes. He is taking metformin currently and continues to work on diet and exercise to decrease risk of diabetes. He denies nausea or hypoglycemia.  At risk for diabetes [name] Hart is at higher than averagerisk for developing diabetes due to his obesity and pre-diabetes. He currently denies polyuria or polydipsia.  Vitamin D deficiency [name] Hart has a diagnosis of vitamin D deficiency. He is currently taking prescription Vit D and denies nausea, vomiting or muscle weakness.  ALLERGIES: Allergies  Allergen Reactions  . Clindamycin Itching and Rash  . Pantoprazole Rash    MEDICATIONS: Current Outpatient Medications on File Prior to Visit  Medication Sig Dispense Refill  . aspirin EC 81 MG tablet Take 81 mg by mouth daily.      [name] atorvastatin (LIPITOR) 10 MG tablet Take 10 mg by mouth daily.      . cetirizine (ZYRTEC) 10 MG tablet Take 10 mg by mouth daily.    . dorzolamidel-timolol (COSOPT PF) 22.3-6.8 MG/ML SOLN ophthalmic solution INSTILL ONE DROP IN [facility] EYE TWICE DAILY    . esomeprazole (NEXIUM) 40 MG capsule Take 40 mg  by mouth daily before breakfast.      . lisinopril (PRINIVIL,ZESTRIL) 5 MG tablet 5 mg daily.    . metFORMIN (GLUCOPHAGE) 500 MG tablet Take 1 tablet (500 mg total) by mouth 2 (two) times daily. 60 tablet 0  . montelukast (SINGULAIR) 10 MG tablet Take 10 mg by mouth at bedtime.    . Omega-3 Fatty Acids (FISH OIL) 1000 MG CAPS Take 1,000 mg by mouth daily.      . vitamin B-12 (CYANOCOBALAMIN) 100 MCG tablet Take 100 mcg by mouth daily. Unknown dose    . Vitamin D, Ergocalciferol, (DRISDOL) 50000 units CAPS capsule Take 1 capsule (50,000 Units total) by mouth every 7 (seven) days. 4 capsule 0   No current facility-administered medications on file prior to visit.     PAST MEDICAL HISTORY: Past Medical History:  Diagnosis Date  . Acid reflux   . Angina    pain in back lft arm aug 12 echo done in epic  neg  . Back pain   . Concussion    42 yrs old  . Fatty liver   . Gallbladder pain   . GERD (gastroesophageal reflux disease)   . Glaucoma   . Hearing difficulty    high pitch nerve deafness  . Hiatal hernia    sliding hernia  . Hyperlipidemia   . Hypertension   . IBS (irritable bowel syndrome)   . Joint pain   .  PONV (postoperative nausea and vomiting)   . Prediabetes   . Seasonal allergies   . Shortness of breath   . Sleep apnea    cpap used  since 06  . SOB (shortness of breath)   . TMJ (dislocation of temporomandibular joint)     PAST SURGICAL HISTORY: Past Surgical History:  Procedure Laterality Date  . CHOLECYSTECTOMY  09  . CHOLESTEATOMA EXCISION  02   x2  lft parotid area  . NASAL SEPTOPLASTY W/ TURBINOPLASTY  11/02/2011   Procedure: NASAL SEPTOPLASTY WITH TURBINATE REDUCTION;  Surgeon: Leonette MostJohn M Byers;  Location: [facility] OR;  Service: ENT;  Laterality: Bilateral;  SEPTOPLASTY WITH BILATERAL TURBINATE REDUCTION  . TONSILLECTOMY      SOCIAL HISTORY: Social History   Tobacco Use  . Smoking status: Never Smoker  . Smokeless tobacco: Never Used  Substance Use Topics  .  Alcohol use: Yes    Comment: light/occas  . Drug use: No    FAMILY HISTORY: Family History  Problem Relation Age of Onset  . Cancer Mother   . Hypertension Father   . Diabetes Father   . Hyperlipidemia Father   . Obesity Father     ROS: Review of Systems  Constitutional: Negative for weight loss.  Gastrointestinal: Negative for nausea and vomiting.  Genitourinary: Negative for frequency.  Musculoskeletal:       Negative muscle weakness  Endo/Heme/Allergies: Negative for polydipsia.       Negative polyphagia Negative hypoglycemia    PHYSICAL EXAM: Blood pressure 124/85, pulse 95, temperature 98.1 F (36.7 C), temperature source Oral, height 5\' 4"  (1.626 m), weight 267 lb (121.1 kg), SpO2 97 %. Body mass index is 45.83 kg/m. Physical Exam  Constitutional: He is oriented to person, place, and time. He appears well-developed and well-nourished.  Cardiovascular: Normal rate.  Pulmonary/Chest: Effort normal.  Musculoskeletal: Normal range of motion.  Neurological: He is oriented to person, place, and time.  Skin: Skin is warm and dry.  Psychiatric: He has a normal mood and affect. His behavior is normal.  Vitals reviewed.   RECENT LABS AND TESTS: BMET    Component Value Date/Time   NA 137 07/03/2017 0829   K 4.2 07/03/2017 0829   CL 99 07/03/2017 0829   CO2 22 07/03/2017 0829   GLUCOSE 93 07/03/2017 0829   GLUCOSE 89 10/30/2011 1600   BUN 14 07/03/2017 0829   CREATININE 0.85 07/03/2017 0829   CALCIUM 9.7 07/03/2017 0829   GFRNONAA 110 07/03/2017 0829   GFRAA 127 07/03/2017 0829   Lab Results  Component Value Date   HGBA1C 6.0 (H) 07/03/2017   HGBA1C 6.2 (H) 03/21/2017   Lab Results  Component Value Date   INSULIN 17.3 07/03/2017   INSULIN 18.8 03/21/2017   CBC    Component Value Date/Time   WBC 6.2 07/03/2017 0829   WBC 8.5 10/30/2011 1600   RBC 4.72 07/03/2017 0829   RBC 4.74 10/30/2011 1600   HGB 13.8 07/03/2017 0829   HCT 40.0 07/03/2017 0829     PLT 294 10/30/2011 1600   MCV 85 07/03/2017 0829   MCH 29.2 07/03/2017 0829   MCH 30.2 10/30/2011 1600   MCHC 34.5 07/03/2017 0829   MCHC 35.2 10/30/2011 1600   RDW 13.2 07/03/2017 0829   LYMPHSABS 1.5 07/03/2017 0829   EOSABS 0.1 07/03/2017 0829   BASOSABS 0.0 07/03/2017 0829   Iron/TIBC/Ferritin/ %Sat No results found for: IRON, TIBC, FERRITIN, IRONPCTSAT Lipid Panel     Component Value Date/Time  CHOL 136 07/03/2017 0829   TRIG 134 07/03/2017 0829   HDL 30 (L) 07/03/2017 0829   LDLCALC 79 07/03/2017 0829   Hepatic Function Panel     Component Value Date/Time   PROT 6.8 07/03/2017 0829   ALBUMIN 4.5 07/03/2017 0829   AST 17 07/03/2017 0829   ALT 21 07/03/2017 0829   ALKPHOS 66 07/03/2017 0829   BILITOT 0.3 07/03/2017 0829      Component Value Date/Time   TSH 1.710 03/21/2017 1057  Results for CHALLEN, SPAINHOUR (MRN [id number]) as of 12/19/2017 17:52  Ref. Range 07/03/2017 08:29  Vitamin D, 25-Hydroxy Latest Ref Range: 30.0 - 100.0 ng/mL 40.7    ASSESSMENT AND PLAN: Prediabetes - Plan: metFORMIN (GLUCOPHAGE) 500 MG tablet  Vitamin D deficiency  At risk for diabetes mellitus  Class 3 severe obesity with serious comorbidity and body mass index (BMI) of 45.0 to 49.9 in adult, unspecified obesity type (HCC)  PLAN:  Pre-Diabetes [name] Hart will continue to work on weight loss, exercise, and decreasing simple carbohydrates in his diet to help decrease the risk of diabetes. We dicussed metformin including benefits and risks. He was informed that eating too many simple carbohydrates or too many calories at one sitting increases the likelihood of GI side effects. [name] Hart agrees to continue taking metformin 500 mg BID #60 and we will refill for 1 month. [name] Hart agrees to follow up with our clinic in 3 weeks as directed to monitor his progress.  Diabetes risk counselling [name] Hart was given extended (15 minutes) diabetes prevention counseling today. He is 42 y.o. male and has  risk factors for diabetes including obesity and pre-diabtetes. We discussed intensive lifestyle modifications today with an emphasis on weight loss as well as increasing exercise and decreasing simple carbohydrates in his diet.  Vitamin D Deficiency [name] Hart was informed that low vitamin D levels contributes to fatigue and are associated with obesity, breast, and colon cancer. [name] Hart agrees to continue taking prescription Vit D @50 ,000 IU every week #4 and will follow up for routine testing of vitamin D, at least 2-3 times per year. He was informed of the risk of over-replacement of vitamin D and agrees to not increase his dose unless he discusses this with [name] first. [name] Hart agrees to follow up with our clinic in 3 weeks.  Obesity [name] Hart is currently is not in the action stage of change. As such, his goal is to get back to weightloss efforts  He has not made a commitment to any specific meal plan.  [name] Hart has been instructed to work up to a goal of 150 minutes of combined cardio and strengthening exercise per week for weight loss and overall health benefits. We discussed the following Behavioral Modification Strategies today: increasing lean protein intake and work on meal planning and easy cooking plans   [name] Hart has agreed to follow up with our clinic in 3 weeks. He was informed of the importance of frequent follow up visits to maximize his success with intensive lifestyle modifications for his multiple health conditions.    OBESITY BEHAVIORAL INTERVENTION VISIT  Today's visit was # 14 out of 22.  Starting weight: 272 lbs Starting date: 03/21/17 Today's weight : 267 lbs  Today's date: 12/19/2017 Total lbs lost to date: 5 (Patients must lose 7 lbs in the first 6 months to continue with counseling)   ASK: We discussed the diagnosis of obesity with [name] Hart today and [name] Hart agreed to give [name] permission to discuss obesity behavioral modification therapy today.  ASSESS: [name] Hart  has the diagnosis of obesity and his BMI today is 45.81 [name] Hart is not in the action stage of change   ADVISE: [name] Hart was educated on the multiple health risks of obesity as well as the benefit of weight loss to improve his health. He was advised of the need for long term treatment and the importance of lifestyle modifications.  AGREE: Multiple dietary modification options and treatment options were discussed and  [name] Hart agreed to the above obesity treatment plan.   Trude Mcburney, am acting as transcriptionist for Illa Level, PA-C I, Illa Level [facility], have reviewed this note and agree with its contents.  I have reviewed the above documentation for accuracy and completeness, and I agree with the above. -Quillian Quince, MD

## 2017-12-30 DIAGNOSIS — H1089 Other conjunctivitis: Secondary | ICD-10-CM | POA: Diagnosis not present

## 2018-01-09 ENCOUNTER — Ambulatory Visit (INDEPENDENT_AMBULATORY_CARE_PROVIDER_SITE_OTHER): Payer: BLUE CROSS/BLUE SHIELD | Admitting: Physician Assistant

## 2018-01-23 ENCOUNTER — Encounter (INDEPENDENT_AMBULATORY_CARE_PROVIDER_SITE_OTHER): Payer: Self-pay | Admitting: Physician Assistant

## 2018-01-23 ENCOUNTER — Ambulatory Visit (INDEPENDENT_AMBULATORY_CARE_PROVIDER_SITE_OTHER): Payer: BLUE CROSS/BLUE SHIELD | Admitting: Physician Assistant

## 2018-01-23 VITALS — BP 137/87 | HR 102 | Temp 98.0°F | Ht 64.0 in | Wt 269.0 lb

## 2018-01-23 DIAGNOSIS — E559 Vitamin D deficiency, unspecified: Secondary | ICD-10-CM | POA: Diagnosis not present

## 2018-01-23 DIAGNOSIS — Z9189 Other specified personal risk factors, not elsewhere classified: Secondary | ICD-10-CM

## 2018-01-23 DIAGNOSIS — Z6841 Body Mass Index (BMI) 40.0 and over, adult: Secondary | ICD-10-CM

## 2018-01-23 DIAGNOSIS — R7303 Prediabetes: Secondary | ICD-10-CM | POA: Diagnosis not present

## 2018-01-23 MED ORDER — METFORMIN HCL 500 MG PO TABS
500.0000 mg | ORAL_TABLET | Freq: Two times a day (BID) | ORAL | 0 refills | Status: DC
Start: 2018-01-23 — End: 2018-03-05

## 2018-01-24 NOTE — Progress Notes (Signed)
Office: [phone]  /  Fax: [phone]   HPI:   Chief Complaint: OBESITY [name] Hart is here to discuss his progress with his obesity treatment plan. He is not on any specific meal plan. He states he is exercising 0 minutes 0 times per week. [name] Hart is not following the structured meal plan, and he has not been keeping track of his meals, and is not journaling. His weight is 269 lb (122 kg) today and has had a weight gain of 2 pounds over a period of 5 weeks since his last visit. He has lost 3 lbs since starting treatment with us.  Pre-Diabetes [name] Hart has a diagnosis of pre-diabetes based on his elevated Hgb A1c and was informed this puts him at greater risk of developing diabetes. He states he has not been taking his metformin twice daily, and he misses his dose  He admits polyphagia and  denies nausea or hypoglycemia.  At risk for diabetes [name] Hart is at higher than average risk for developing diabetes due to his obesity and pre-diabetes. He currently denies polyuria or polydipsia.  Vitamin D deficiency [name] Hart has a diagnosis of vitamin D deficiency. He is currently taking vit D and denies nausea, vomiting or muscle weakness.   Ref. Range 07/03/2017 08:29  Vitamin D, 25-Hydroxy Latest Ref Range: 30.0 - 100.0 ng/mL 40.7    ALLERGIES: Allergies  Allergen Reactions  . Clindamycin Itching and Rash  . Pantoprazole Rash    MEDICATIONS: Current Outpatient Medications on File Prior to Visit  Medication Sig Dispense Refill  . aspirin EC 81 MG tablet Take 81 mg by mouth daily.      [name]. atorvastatin (LIPITOR) 10 MG tablet Take 10 mg by mouth daily.      . cetirizine (ZYRTEC) 10 MG tablet Take 10 mg by mouth daily.    . dorzolamidel-timolol (COSOPT PF) 22.3-6.8 MG/ML SOLN [facility]mic [facility]    . esomeprazole (NEXIUM) 40 MG capsule Take 40 mg by mouth daily before breakfast.      . lisinopril (PRINIVIL,ZESTRIL) 5 MG tablet 5 mg daily.    .  montelukast (SINGULAIR) 10 MG tablet Take 10 mg by mouth at bedtime.    . Omega-3 Fatty Acids (FISH OIL) 1000 MG CAPS Take 1,000 mg by mouth daily.      . vitamin B-12 (CYANOCOBALAMIN) 100 MCG tablet Take 100 mcg by mouth daily. Unknown dose    . Vitamin D, Ergocalciferol, (DRISDOL) 50000 units CAPS capsule Take 1 capsule (50,000 Units total) by mouth every 7 (seven) days. 4 capsule 0   No current facility-administered medications on file prior to visit.     PAST MEDICAL HISTORY: Past Medical History:  Diagnosis Date  . Acid reflux   . Angina    pain in back lft arm aug 12 echo done in epic  neg  . Back pain   . Concussion    42 yrs old  . Fatty liver   . Gallbladder pain   . GERD (gastroesophageal reflux disease)   . Glaucoma   . Hearing difficulty    high pitch nerve deafness  . Hiatal hernia    sliding hernia  . Hyperlipidemia   . Hypertension   . IBS (irritable bowel syndrome)   . Joint pain   . PONV (postoperative nausea and vomiting)   . Prediabetes   . Seasonal allergies   . Shortness of breath   . Sleep apnea    cpap used  since  06  . SOB (shortness of breath)   . TMJ (dislocation of temporomandibular joint)     PAST SURGICAL HISTORY: Past Surgical History:  Procedure Laterality Date  . CHOLECYSTECTOMY  09  . CHOLESTEATOMA EXCISION  02   x2  lft parotid area  . NASAL SEPTOPLASTY W/ TURBINOPLASTY  11/02/2011   Procedure: NASAL SEPTOPLASTY WITH TURBINATE REDUCTION;  Surgeon: Leonette Most;  Location: [facility] OR;  Service: ENT;  Laterality: Bilateral;  SEPTOPLASTY WITH BILATERAL TURBINATE REDUCTION  . TONSILLECTOMY      SOCIAL HISTORY: Social History   Tobacco Use  . Smoking status: Never Smoker  . Smokeless tobacco: Never Used  Substance Use Topics  . Alcohol use: Yes    Comment: light/occas  . Drug use: No    FAMILY HISTORY: Family History  Problem Relation Age of Onset  . Cancer Mother   . Hypertension Father   . Diabetes Father   . Hyperlipidemia  Father   . Obesity Father     ROS: Review of Systems  Constitutional: Negative for weight loss.  Gastrointestinal: Negative for nausea and vomiting.  Genitourinary: Negative for frequency.  Musculoskeletal:       Negative for muscle weakness  Endo/Heme/Allergies: Negative for polydipsia.       Positive for polyphagia Negative for hypoglycemia    PHYSICAL EXAM: Blood pressure 137/87, pulse (!) 102, temperature 98 F (36.7 C), temperature source Oral, height 5\' 4"  (1.626 m), weight 269 lb (122 kg), SpO2 99 %. Body mass index is 46.17 kg/m. Physical Exam  Constitutional: He is oriented to person, place, and time. He appears well-developed and well-nourished.  Cardiovascular: Tachycardia present.  Pulmonary/Chest: Effort normal.  Musculoskeletal: Normal range of motion.  Neurological: He is oriented to person, place, and time.  Skin: Skin is warm and dry.  Psychiatric: He has a normal mood and affect. His behavior is normal.  Vitals reviewed.   RECENT LABS AND TESTS: BMET    Component Value Date/Time   NA 137 07/03/2017 0829   K 4.2 07/03/2017 0829   CL 99 07/03/2017 0829   CO2 22 07/03/2017 0829   GLUCOSE 93 07/03/2017 0829   GLUCOSE 89 10/30/2011 1600   BUN 14 07/03/2017 0829   CREATININE 0.85 07/03/2017 0829   CALCIUM 9.7 07/03/2017 0829   GFRNONAA 110 07/03/2017 0829   GFRAA 127 07/03/2017 0829   Lab Results  Component Value Date   HGBA1C 6.0 (H) 07/03/2017   HGBA1C 6.2 (H) 03/21/2017   Lab Results  Component Value Date   INSULIN 17.3 07/03/2017   INSULIN 18.8 03/21/2017   CBC    Component Value Date/Time   WBC 6.2 07/03/2017 0829   WBC 8.5 10/30/2011 1600   RBC 4.72 07/03/2017 0829   RBC 4.74 10/30/2011 1600   HGB 13.8 07/03/2017 0829   HCT 40.0 07/03/2017 0829   PLT 294 10/30/2011 1600   MCV 85 07/03/2017 0829   MCH 29.2 07/03/2017 0829   MCH 30.2 10/30/2011 1600   MCHC 34.5 07/03/2017 0829   MCHC 35.2 10/30/2011 1600   RDW 13.2 07/03/2017  0829   LYMPHSABS 1.5 07/03/2017 0829   EOSABS 0.1 07/03/2017 0829   BASOSABS 0.0 07/03/2017 0829   Iron/TIBC/Ferritin/ %Sat No results found for: IRON, TIBC, FERRITIN, IRONPCTSAT Lipid Panel     Component Value Date/Time   CHOL 136 07/03/2017 0829   TRIG 134 07/03/2017 0829   HDL 30 (L) 07/03/2017 0829   LDLCALC 79 07/03/2017 0829   Hepatic Function Panel  Component Value Date/Time   PROT 6.8 07/03/2017 0829   ALBUMIN 4.5 07/03/2017 0829   AST 17 07/03/2017 0829   ALT 21 07/03/2017 0829   ALKPHOS 66 07/03/2017 0829   BILITOT 0.3 07/03/2017 0829      Component Value Date/Time   TSH 1.710 03/21/2017 1057     Ref. Range 07/03/2017 08:29  Vitamin D, 25-Hydroxy Latest Ref Range: 30.0 - 100.0 ng/mL 40.7   ASSESSMENT AND PLAN: Prediabetes - Plan: metFORMIN (GLUCOPHAGE) 500 MG tablet  Vitamin D deficiency  At risk for diabetes mellitus  Class 3 severe obesity with serious comorbidity and body mass index (BMI) of 45.0 to 49.9 in adult, unspecified obesity type (HCC)  PLAN:  Pre-Diabetes We dicussed metformin including benefits and risks. He was informed that eating too many simple carbohydrates or too many calories at one sitting increases the likelihood of GI side effects. Jacorion agrees to continue metformin and will follow up with [name] as directed to monitor his progress.  Diabetes risk counseling Yuji was given extended (15 minutes) diabetes prevention counseling today. He is 42 y.o. male and has risk factors for diabetes including obesity and pre-diabetes. We discussed intensive lifestyle modifications today with an emphasis on weight loss as well as increasing exercise and decreasing simple carbohydrates in his diet.  Vitamin D Deficiency Od[place] was informed that low vitamin D levels contributes to fatigue and are associated with obesity, breast, and colon cancer. He agrees to continue to take prescription Vit D @50 ,000 IU every week and will follow up for routine  testing of vitamin D, at least 2-3 times per year. He was informed of the risk of over-replacement of vitamin D and agrees to not increase his dose unless he discusses this with [name] first.  Obesity Nezar is not currently in the action stage of change. As such, his goal is to get back to weightloss efforts  He has agreed to portion control better and make smarter food choices, such as increase vegetables and decrease simple carbohydrates  Elier has been instructed to work up to a goal of 150 minutes of combined cardio and strengthening exercise per week for weight loss and overall health benefits. We discussed the following Behavioral Modification Strategies today: increasing lean protein intake and work on meal planning and easy cooking plans  Josedejesus has agreed to follow up with our clinic in 3 weeks. He was informed of the importance of frequent follow up visits to maximize his success with intensive lifestyle modifications for his multiple health conditions.   OBESITY BEHAVIORAL INTERVENTION VISIT  Today's visit was # 15 out of 22.  Starting weight: 272 lbs Starting date: 03/21/17 Today's weight : 269 lbs Today's date: 01/23/2018 Total lbs lost to date: 3 (Patients must lose 7 lbs in the first 6 months to continue with counseling)   ASK: We discussed the diagnosis of obesity with [name] today and [name] Hart agreed to give [name] permission to discuss obesity behavioral modification therapy today.  ASSESS: Alik has the diagnosis of obesity and his BMI today is 46.15 Olivia is in the action stage of change   ADVISE: Euclide was educated on the multiple health risks of obesity as well as the benefit of weight loss to improve his health. He was advised of the need for long term treatment and the importance of lifestyle modifications.  AGREE: Multiple dietary modification options and treatment options were discussed and  Chester agreed to the above obesity treatment  plan.   I,  [place] Crane, am acting as transcriptionist for [employer], PA-C I, Illa Level [facility], have reviewed this note and agree with its content.

## 2018-02-12 ENCOUNTER — Ambulatory Visit (INDEPENDENT_AMBULATORY_CARE_PROVIDER_SITE_OTHER): Payer: BLUE CROSS/BLUE SHIELD | Admitting: Physician Assistant

## 2018-02-12 VITALS — BP 123/83 | HR 76 | Ht 64.0 in | Wt 271.0 lb

## 2018-02-12 DIAGNOSIS — Z6841 Body Mass Index (BMI) 40.0 and over, adult: Secondary | ICD-10-CM | POA: Diagnosis not present

## 2018-02-12 DIAGNOSIS — R7303 Prediabetes: Secondary | ICD-10-CM

## 2018-02-13 ENCOUNTER — Ambulatory Visit (INDEPENDENT_AMBULATORY_CARE_PROVIDER_SITE_OTHER): Payer: BLUE CROSS/BLUE SHIELD | Admitting: Physician Assistant

## 2018-02-13 NOTE — Progress Notes (Signed)
Office: [phone]  /  Fax: [phone]   HPI:   Chief Complaint: OBESITY [name] Hart is here to discuss his progress with his obesity treatment plan. He is on the portion control better and make smarter food choices plan and is following his eating plan approximately 40 % of the time. He states he is exercising 0 minutes 0 times per week. [name] Hart is not ready to make the nutritional changes advised by [name] Hart. He continues to not pre-plan his meals. He keeps unhealthy foods in the home and is eating out more. Kraig declines to eat better snack options. His weight is 271 lb (122.9 kg) today and has had a weight gain of 2 pounds over a period of 3 weeks since his last visit. He has lost 1 lb since starting treatment with [name] Hart.  Pre-Diabetes [name] Hart has a diagnosis of pre-diabetes based on his elevated Hgb A1c and was informed this puts him at greater risk of developing diabetes. He is taking metformin currently and states he has been skipping his metformin doses. [name] Hart continues to work on diet and exercise to decrease risk of diabetes. He denies nausea or hypoglycemia.   ALLERGIES: Allergies  Allergen Reactions  . Clindamycin Itching and Rash  . Pantoprazole Rash    MEDICATIONS: Current Outpatient Medications on File Prior to Visit  Medication Sig Dispense Refill  . aspirin EC 81 MG tablet Take 81 mg by mouth daily.      [name] Hart atorvastatin (LIPITOR) 10 MG tablet Take 10 mg by mouth daily.      . cetirizine (ZYRTEC) 10 MG tablet Take 10 mg by mouth daily.    . dorzolamidel-timolol (COSOPT PF) 22.3-6.8 MG/ML SOLN ophthalmic solution INSTILL ONE DROP IN [facility] EYE TWICE DAILY    . esomeprazole (NEXIUM) 40 MG capsule Take 40 mg by mouth daily before breakfast.      . lisinopril (PRINIVIL,ZESTRIL) 5 MG tablet 5 mg daily.    . metFORMIN (GLUCOPHAGE) 500 MG tablet Take 1 tablet (500 mg total) by mouth 2 (two) times daily. 60 tablet 0  . montelukast (SINGULAIR) 10 MG tablet Take 10 mg by mouth at  bedtime.    . Omega-3 Fatty Acids (FISH OIL) 1000 MG CAPS Take 1,000 mg by mouth daily.      . vitamin B-12 (CYANOCOBALAMIN) 100 MCG tablet Take 100 mcg by mouth daily. Unknown dose    . Vitamin D, Ergocalciferol, (DRISDOL) 50000 units CAPS capsule Take 1 capsule (50,000 Units total) by mouth every 7 (seven) days. 4 capsule 0   No current facility-administered medications on file prior to visit.     PAST MEDICAL HISTORY: Past Medical History:  Diagnosis Date  . Acid reflux   . Angina    pain in back lft arm aug 12 echo done in epic  neg  . Back pain   . Concussion    42 yrs old  . Fatty liver   . Gallbladder pain   . GERD (gastroesophageal reflux disease)   . Glaucoma   . Hearing difficulty    high pitch nerve deafness  . Hiatal hernia    sliding hernia  . Hyperlipidemia   . Hypertension   . IBS (irritable bowel syndrome)   . Joint pain   . PONV (postoperative nausea and vomiting)   . Prediabetes   . Seasonal allergies   . Shortness of breath   . Sleep apnea    cpap used  since 06  . SOB (shortness of breath)   .  TMJ (dislocation of temporomandibular joint)     PAST SURGICAL HISTORY: Past Surgical History:  Procedure Laterality Date  . CHOLECYSTECTOMY  09  . CHOLESTEATOMA EXCISION  02   x2  lft parotid area  . NASAL SEPTOPLASTY W/ TURBINOPLASTY  11/02/2011   Procedure: NASAL SEPTOPLASTY WITH TURBINATE REDUCTION;  Surgeon: [name] Hart;  Location: [facility] OR;  Service: ENT;  Laterality: Bilateral;  SEPTOPLASTY WITH BILATERAL TURBINATE REDUCTION  . TONSILLECTOMY      SOCIAL HISTORY: Social History   Tobacco Use  . Smoking status: Never Smoker  . Smokeless tobacco: Never Used  Substance Use Topics  . Alcohol use: Yes    Comment: light/occas  . Drug use: No    FAMILY HISTORY: Family History  Problem Relation Age of Onset  . Cancer Mother   . Hypertension Father   . Diabetes Father   . Hyperlipidemia Father   . Obesity Father     ROS: Review of Systems    Constitutional: Negative for weight loss.  Gastrointestinal: Negative for nausea.  Endo/Heme/Allergies:       Negative for hypoglycemia    PHYSICAL EXAM: Blood pressure 123/83, pulse 76, height 5\' 4"  (1.626 m), weight 271 lb (122.9 kg), SpO2 98 %. Body mass index is 46.52 kg/m. Physical Exam  Constitutional: He is oriented to person, place, and time. He appears well-developed and well-nourished.  Cardiovascular: Normal rate.  Pulmonary/Chest: Effort normal.  Musculoskeletal: Normal range of motion.  Neurological: He is oriented to person, place, and time.  Skin: Skin is warm and dry.  Psychiatric: He has a normal mood and affect. His behavior is normal.  Vitals reviewed.   RECENT LABS AND TESTS: BMET    Component Value Date/Time   NA 137 07/03/2017 0829   K 4.2 07/03/2017 0829   CL 99 07/03/2017 0829   CO2 22 07/03/2017 0829   GLUCOSE 93 07/03/2017 0829   GLUCOSE 89 10/30/2011 1600   BUN 14 07/03/2017 0829   CREATININE 0.85 07/03/2017 0829   CALCIUM 9.7 07/03/2017 0829   GFRNONAA 110 07/03/2017 0829   GFRAA 127 07/03/2017 0829   Lab Results  Component Value Date   HGBA1C 6.0 (H) 07/03/2017   HGBA1C 6.2 (H) 03/21/2017   Lab Results  Component Value Date   INSULIN 17.3 07/03/2017   INSULIN 18.8 03/21/2017   CBC    Component Value Date/Time   WBC 6.2 07/03/2017 0829   WBC 8.5 10/30/2011 1600   RBC 4.72 07/03/2017 0829   RBC 4.74 10/30/2011 1600   HGB 13.8 07/03/2017 0829   HCT 40.0 07/03/2017 0829   PLT 294 10/30/2011 1600   MCV 85 07/03/2017 0829   MCH 29.2 07/03/2017 0829   MCH 30.2 10/30/2011 1600   MCHC 34.5 07/03/2017 0829   MCHC 35.2 10/30/2011 1600   RDW 13.2 07/03/2017 0829   LYMPHSABS 1.5 07/03/2017 0829   EOSABS 0.1 07/03/2017 0829   BASOSABS 0.0 07/03/2017 0829   Iron/TIBC/Ferritin/ %Sat No results found for: IRON, TIBC, FERRITIN, IRONPCTSAT Lipid Panel     Component Value Date/Time   CHOL 136 07/03/2017 0829   TRIG 134 07/03/2017  0829   HDL 30 (L) 07/03/2017 0829   LDLCALC 79 07/03/2017 0829   Hepatic Function Panel     Component Value Date/Time   PROT 6.8 07/03/2017 0829   ALBUMIN 4.5 07/03/2017 0829   AST 17 07/03/2017 0829   ALT 21 07/03/2017 0829   ALKPHOS 66 07/03/2017 0829   BILITOT 0.3 07/03/2017 0829  Component Value Date/Time   TSH 1.710 03/21/2017 1057    ASSESSMENT AND PLAN: Prediabetes  Class 3 severe obesity with serious comorbidity and body mass index (BMI) of 45.0 to 49.9 in adult, unspecified obesity type (HCC)  PLAN:  Pre-Diabetes Nik will continue to work on weight loss, exercise, and decreasing simple carbohydrates in his diet to help decrease the risk of diabetes. We dicussed metformin including benefits and risks. He was informed that eating too many simple carbohydrates or too many calories at one sitting increases the likelihood of GI side effects. Skylan will continue metformin for now and a prescription was not written today. Shafer agreed to follow up with [name] Hart as directed to monitor his progress.  We spent > than 50% of the 15 minute visit on the counseling as documented in the note.  Obesity Delfino is currently in the action stage of change. As such, his goal is to get back to weight loss efforts  He has agreed to portion control better and make smarter food choices, such as increase vegetables and decrease simple carbohydrates  Bogdan has been instructed to work up to a goal of 150 minutes of combined cardio and strengthening exercise per week for weight loss and overall health benefits. We discussed the following Behavioral Modification Strategies today: keeping healthy foods in the home and better snacking choices  Kendrick has agreed to follow up with our clinic in 3 weeks. He was informed of the importance of frequent follow up visits to maximize his success with intensive lifestyle modifications for his multiple health conditions.    OBESITY BEHAVIORAL  INTERVENTION VISIT  Today's visit was # 16 out of 22.  Starting weight: 272 lbs Starting date: 03/21/17 Today's weight : 271 lbs Today's date: 02/12/2018 Total lbs lost to date: 1 (Patients must lose 7 lbs in the first 6 months to continue with counseling)   ASK: We discussed the diagnosis of obesity with [name] today and [name] agreed to give [name] Hart permission to discuss obesity behavioral modification therapy today.  ASSESS: Bodhi has the diagnosis of obesity and his BMI today is 46.49 Rondy is in the action stage of change   ADVISE: Adriano was educated on the multiple health risks of obesity as well as the benefit of weight loss to improve his health. He was advised of the need for long term treatment and the importance of lifestyle modifications.  AGREE: Multiple dietary modification options and treatment options were discussed and  Loudon agreed to the above obesity treatment plan.   Cristi Loron, am acting as transcriptionist for [employer], PA-C I, Illa Level [facility], have reviewed this note and agree with its content

## 2018-02-27 ENCOUNTER — Ambulatory Visit: Payer: BLUE CROSS/BLUE SHIELD | Admitting: Neurology

## 2018-03-05 ENCOUNTER — Ambulatory Visit (INDEPENDENT_AMBULATORY_CARE_PROVIDER_SITE_OTHER): Payer: BLUE CROSS/BLUE SHIELD | Admitting: Physician Assistant

## 2018-03-05 ENCOUNTER — Encounter (INDEPENDENT_AMBULATORY_CARE_PROVIDER_SITE_OTHER): Payer: Self-pay | Admitting: Physician Assistant

## 2018-03-05 VITALS — BP 132/84 | HR 93 | Temp 97.7°F | Ht 64.0 in | Wt 268.0 lb

## 2018-03-05 DIAGNOSIS — Z6841 Body Mass Index (BMI) 40.0 and over, adult: Secondary | ICD-10-CM

## 2018-03-05 DIAGNOSIS — R7303 Prediabetes: Secondary | ICD-10-CM | POA: Diagnosis not present

## 2018-03-05 DIAGNOSIS — E559 Vitamin D deficiency, unspecified: Secondary | ICD-10-CM

## 2018-03-05 DIAGNOSIS — Z9189 Other specified personal risk factors, not elsewhere classified: Secondary | ICD-10-CM | POA: Diagnosis not present

## 2018-03-05 MED ORDER — METFORMIN HCL 500 MG PO TABS
500.0000 mg | ORAL_TABLET | Freq: Two times a day (BID) | ORAL | 0 refills | Status: AC
Start: 2018-03-05 — End: ?

## 2018-03-06 NOTE — Progress Notes (Signed)
Office: [phone]  /  Fax: [phone]   HPI:   Chief Complaint: OBESITY [name] Hart is here to discuss his progress with his obesity treatment plan. He is on the portion control better and make smarter food choices, such as increase vegetables and decrease simple carbohydrates  and is following his eating plan approximately 40 % of the time. He states he is walking for 20 minutes 4 times per week. [name] Hart continues to struggle with pre-planning his meals and states he is not mindful of his eating. Also, he has had increased emotional eating.  His weight is 268 lb (121.6 kg) today and has had a weight loss of 3 pounds over a period of 3 weeks since his last visit. He has lost 4 lbs since starting treatment with [name].  Pre-Diabetes [name] Hart has a diagnosis of pre-diabetes based on his elevated Hgb A1c and was informed this puts him at greater risk of developing diabetes. He is taking metformin currently and continues to work on diet and exercise to decrease risk of diabetes. He denies polyphagia, nausea or hypoglycemia.  At risk for diabetes [name] Hart is at higher than average risk for developing diabetes due to his obesity and pre-diabetes. He currently denies polyuria or polydipsia.  Vitamin D Deficiency [name] Hart has a diagnosis of vitamin D deficiency. He is currently taking prescription Vit D and denies nausea, vomiting or muscle weakness.  ALLERGIES: Allergies  Allergen Reactions  . Clindamycin Itching and Rash  . Pantoprazole Rash    MEDICATIONS: Current Outpatient Medications on File Prior to Visit  Medication Sig Dispense Refill  . aspirin EC 81 MG tablet Take 81 mg by mouth daily.      [name] atorvastatin (LIPITOR) 10 MG tablet Take 10 mg by mouth daily.      . cetirizine (ZYRTEC) 10 MG tablet Take 10 mg by mouth daily.    . dorzolamidel-timolol (COSOPT PF) 22.3-6.8 MG/ML SOLN ophthalmic solution INSTILL ONE DROP IN [facility] EYE TWICE DAILY    . esomeprazole (NEXIUM) 40 MG capsule Take  40 mg by mouth daily before breakfast.      . lisinopril (PRINIVIL,ZESTRIL) 5 MG tablet 5 mg daily.    . montelukast (SINGULAIR) 10 MG tablet Take 10 mg by mouth at bedtime.    . Omega-3 Fatty Acids (FISH OIL) 1000 MG CAPS Take 1,000 mg by mouth daily.      . vitamin B-12 (CYANOCOBALAMIN) 100 MCG tablet Take 100 mcg by mouth daily. Unknown dose    . Vitamin D, Ergocalciferol, (DRISDOL) 50000 units CAPS capsule Take 1 capsule (50,000 Units total) by mouth every 7 (seven) days. 4 capsule 0   No current facility-administered medications on file prior to visit.     PAST MEDICAL HISTORY: Past Medical History:  Diagnosis Date  . Acid reflux   . Angina    pain in back lft arm aug 12 echo done in epic  neg  . Back pain   . Concussion    42 yrs old  . Fatty liver   . Gallbladder pain   . GERD (gastroesophageal reflux disease)   . Glaucoma   . Hearing difficulty    high pitch nerve deafness  . Hiatal hernia    sliding hernia  . Hyperlipidemia   . Hypertension   . IBS (irritable bowel syndrome)   . Joint pain   . PONV (postoperative nausea and vomiting)   . Prediabetes   . Seasonal allergies   . Shortness of breath   . Sleep  apnea    cpap used  since 06  . SOB (shortness of breath)   . TMJ (dislocation of temporomandibular joint)     PAST SURGICAL HISTORY: Past Surgical History:  Procedure Laterality Date  . CHOLECYSTECTOMY  09  . CHOLESTEATOMA EXCISION  02   x2  lft parotid area  . NASAL SEPTOPLASTY W/ TURBINOPLASTY  11/02/2011   Procedure: NASAL SEPTOPLASTY WITH TURBINATE REDUCTION;  Surgeon: Leonette Most;  Location: [facility] OR;  Service: ENT;  Laterality: Bilateral;  SEPTOPLASTY WITH BILATERAL TURBINATE REDUCTION  . TONSILLECTOMY      SOCIAL HISTORY: Social History   Tobacco Use  . Smoking status: Never Smoker  . Smokeless tobacco: Never Used  Substance Use Topics  . Alcohol use: Yes    Comment: light/occas  . Drug use: No    FAMILY HISTORY: Family History  Problem  Relation Age of Onset  . Cancer Mother   . Hypertension Father   . Diabetes Father   . Hyperlipidemia Father   . Obesity Father     ROS: Review of Systems  Constitutional: Positive for weight loss.  Gastrointestinal: Negative for nausea and vomiting.  Genitourinary: Negative for frequency.  Musculoskeletal:       Negative muscle weakness  Endo/Heme/Allergies: Negative for polydipsia.       Negative polyphagia Negative hypoglycemia    PHYSICAL EXAM: Blood pressure 132/84, pulse 93, temperature 97.7 F (36.5 C), temperature source Oral, height 5\' 4"  (1.626 m), weight 268 lb (121.6 kg), SpO2 97 %. Body mass index is 46 kg/m. Physical Exam  Constitutional: He is oriented to person, place, and time. He appears well-developed and well-nourished.  Cardiovascular: Normal rate.  Pulmonary/Chest: Effort normal.  Musculoskeletal: Normal range of motion.  Neurological: He is oriented to person, place, and time.  Skin: Skin is warm and dry.  Psychiatric: He has a normal mood and affect. His behavior is normal.  Vitals reviewed.   RECENT LABS AND TESTS: BMET    Component Value Date/Time   NA 137 07/03/2017 0829   K 4.2 07/03/2017 0829   CL 99 07/03/2017 0829   CO2 22 07/03/2017 0829   GLUCOSE 93 07/03/2017 0829   GLUCOSE 89 10/30/2011 1600   BUN 14 07/03/2017 0829   CREATININE 0.85 07/03/2017 0829   CALCIUM 9.7 07/03/2017 0829   GFRNONAA 110 07/03/2017 0829   GFRAA 127 07/03/2017 0829   Lab Results  Component Value Date   HGBA1C 6.0 (H) 07/03/2017   HGBA1C 6.2 (H) 03/21/2017   Lab Results  Component Value Date   INSULIN 17.3 07/03/2017   INSULIN 18.8 03/21/2017   CBC    Component Value Date/Time   WBC 6.2 07/03/2017 0829   WBC 8.5 10/30/2011 1600   RBC 4.72 07/03/2017 0829   RBC 4.74 10/30/2011 1600   HGB 13.8 07/03/2017 0829   HCT 40.0 07/03/2017 0829   PLT 294 10/30/2011 1600   MCV 85 07/03/2017 0829   MCH 29.2 07/03/2017 0829   MCH 30.2 10/30/2011 1600     MCHC 34.5 07/03/2017 0829   MCHC 35.2 10/30/2011 1600   RDW 13.2 07/03/2017 0829   LYMPHSABS 1.5 07/03/2017 0829   EOSABS 0.1 07/03/2017 0829   BASOSABS 0.0 07/03/2017 0829   Iron/TIBC/Ferritin/ %Sat No results found for: IRON, TIBC, FERRITIN, IRONPCTSAT Lipid Panel     Component Value Date/Time   CHOL 136 07/03/2017 0829   TRIG 134 07/03/2017 0829   HDL 30 (Hart) 07/03/2017 0829   LDLCALC 79 07/03/2017 0829  Hepatic Function Panel     Component Value Date/Time   PROT 6.8 07/03/2017 0829   ALBUMIN 4.5 07/03/2017 0829   AST 17 07/03/2017 0829   ALT 21 07/03/2017 0829   ALKPHOS 66 07/03/2017 0829   BILITOT 0.3 07/03/2017 0829      Component Value Date/Time   TSH 1.710 03/21/2017 1057  Results for [name] GaribaldiMURPHY, [name] Hart (MRN [id number]) as of 03/06/2018 07:41  Ref. Range 07/03/2017 08:29  Vitamin D, 25-Hydroxy Latest Ref Range: 30.0 - 100.0 ng/mL 40.7    ASSESSMENT AND PLAN: Prediabetes - Plan: metFORMIN (GLUCOPHAGE) 500 MG tablet  Vitamin D deficiency  At risk for diabetes mellitus  Class 3 severe obesity with serious comorbidity and body mass index (BMI) of 45.0 to 49.9 in adult, unspecified obesity type (HCC)  PLAN:  Pre-Diabetes [name] Hart will continue to work on weight loss, exercise, and decreasing simple carbohydrates in his diet to help decrease the risk of diabetes. We dicussed metformin including benefits and risks. He was informed that eating too many simple carbohydrates or too many calories at one sitting increases the likelihood of GI side effects. [name] Hart agrees to continue taking metformin 500 mg BID #60 and we will refill for 1 month. [name] Hart agrees to follow up with our clinic in 3 weeks as directed to monitor his progress.  Diabetes risk counselling [name] Hart was given extended (15 minutes) diabetes prevention counseling today. He is 42 y.o. male and has risk factors for diabetes including obesity and pre-diabetes. We discussed intensive lifestyle modifications  today with an emphasis on weight loss as well as increasing exercise and decreasing simple carbohydrates in his diet.  Vitamin D Deficiency [name] Hart was informed that low vitamin D levels contributes to fatigue and are associated with obesity, breast, and colon cancer. [name] Hart agrees to continue taking prescription Vit D @50 ,000 IU every week and will follow up for routine testing of vitamin D, at least 2-3 times per year. He was informed of the risk of over-replacement of vitamin D and agrees to not increase his dose unless he discusses this with us first. [name] Hart agrees to follow up with our clinic in 3 weeks.  Obesity [name] Hart is currently in the action stage of change. As such, his goal is to continue with weight loss efforts He has agreed to portion control better and make smarter food choices, such as increase vegetables and decrease simple carbohydrates  [name] Hart has been instructed to work up to a goal of 150 minutes of combined cardio and strengthening exercise per week for weight loss and overall health benefits. We discussed the following Behavioral Modification Strategies today: decrease junk food and planning for success   [name] Hart has agreed to follow up with our clinic in 3 weeks. He was informed of the importance of frequent follow up visits to maximize his success with intensive lifestyle modifications for his multiple health conditions.   OBESITY BEHAVIORAL INTERVENTION VISIT  Today's visit was # 17 out of 22.  Starting weight: 272 lbs Starting date: 03/21/17 Today's weight : 268 lbs Today's date: 03/05/2018 Total lbs lost to date: 4 (Patients must lose 7 lbs in the first 6 months to continue with counseling)   ASK: We discussed the diagnosis of obesity with [name] GaribaldiPatrick Hart Hart today and [name] HartPatrick agreed to give us permission to discuss obesity behavioral modification therapy today.  ASSESS: [name] Hart has the diagnosis of obesity and his BMI today is 45.98 [name] Hart is in the  action stage of change   ADVISE: [name] Hart was  educated on the multiple health risks of obesity as well as the benefit of weight loss to improve his health. He was advised of the need for long term treatment and the importance of lifestyle modifications.  AGREE: Multiple dietary modification options and treatment options were discussed and  Janson agreed to the above obesity treatment plan.   Trude Mcburney, am acting as transcriptionist for [employer], PA-C I, Illa Level [facility], have reviewed this note and agree with its content

## 2018-03-26 ENCOUNTER — Encounter (INDEPENDENT_AMBULATORY_CARE_PROVIDER_SITE_OTHER): Payer: Self-pay

## 2018-03-26 ENCOUNTER — Ambulatory Visit (INDEPENDENT_AMBULATORY_CARE_PROVIDER_SITE_OTHER): Payer: BLUE CROSS/BLUE SHIELD | Admitting: Physician Assistant

## 2018-04-10 ENCOUNTER — Other Ambulatory Visit (INDEPENDENT_AMBULATORY_CARE_PROVIDER_SITE_OTHER): Payer: Self-pay | Admitting: Physician Assistant

## 2018-04-10 DIAGNOSIS — R7303 Prediabetes: Secondary | ICD-10-CM

## 2018-05-28 DIAGNOSIS — E119 Type 2 diabetes mellitus without complications: Secondary | ICD-10-CM | POA: Diagnosis not present

## 2018-05-28 DIAGNOSIS — K219 Gastro-esophageal reflux disease without esophagitis: Secondary | ICD-10-CM | POA: Diagnosis not present

## 2018-05-28 DIAGNOSIS — J302 Other seasonal allergic rhinitis: Secondary | ICD-10-CM | POA: Diagnosis not present

## 2018-05-28 DIAGNOSIS — E7800 Pure hypercholesterolemia, unspecified: Secondary | ICD-10-CM | POA: Diagnosis not present

## 2018-05-28 DIAGNOSIS — E559 Vitamin D deficiency, unspecified: Secondary | ICD-10-CM | POA: Diagnosis not present

## 2018-05-28 DIAGNOSIS — H40233 Intermittent angle-closure glaucoma, bilateral: Secondary | ICD-10-CM | POA: Diagnosis not present

## 2018-06-24 DIAGNOSIS — J22 Unspecified acute lower respiratory infection: Secondary | ICD-10-CM | POA: Diagnosis not present

## 2018-06-24 DIAGNOSIS — J029 Acute pharyngitis, unspecified: Secondary | ICD-10-CM | POA: Diagnosis not present

## 2018-07-11 DIAGNOSIS — K219 Gastro-esophageal reflux disease without esophagitis: Secondary | ICD-10-CM | POA: Diagnosis not present

## 2018-07-11 DIAGNOSIS — R1032 Left lower quadrant pain: Secondary | ICD-10-CM | POA: Diagnosis not present

## 2018-07-11 DIAGNOSIS — K440 Diaphragmatic hernia with obstruction, without gangrene: Secondary | ICD-10-CM | POA: Diagnosis not present

## 2018-07-11 DIAGNOSIS — K5900 Constipation, unspecified: Secondary | ICD-10-CM | POA: Diagnosis not present

## 2018-07-15 DIAGNOSIS — J22 Unspecified acute lower respiratory infection: Secondary | ICD-10-CM | POA: Diagnosis not present

## 2018-07-31 DIAGNOSIS — R1033 Periumbilical pain: Secondary | ICD-10-CM | POA: Diagnosis not present

## 2018-07-31 DIAGNOSIS — K429 Umbilical hernia without obstruction or gangrene: Secondary | ICD-10-CM | POA: Diagnosis not present

## 2018-08-14 IMAGING — MR MR LUMBAR SPINE W/O CM
4 of 7 series · 18 of 48 positions shown · non-contrast
Comparison: Lumbar spine radiograph 07/17/2016l

CLINICAL DATA: Low back pain radiating to right leg.

EXAM:
MRI LUMBAR SPINE WITHOUT CONTRAST
TECHNIQUE: Multiplanar, multisequence MR imaging of the lumbar spine was
performed. No intravenous contrast was administered.

[Series 6: T2 · sagittal · 4.0mm · 0.73mm/px · 5 of 15 slices shown (1 of 3)]
[im 1/15]
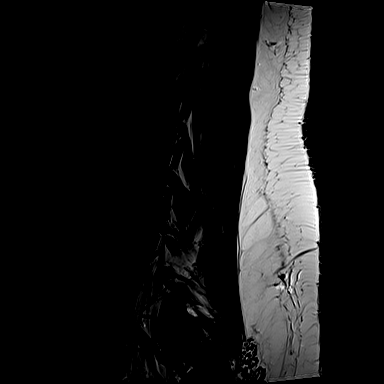
[im 4/15]
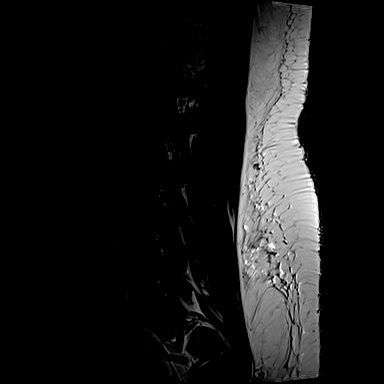
[im 8/15]
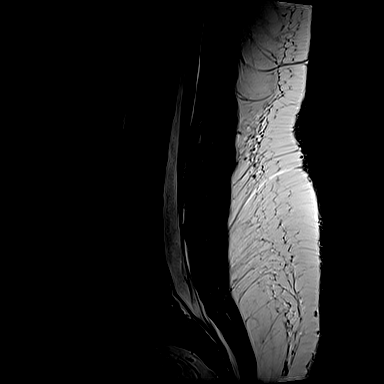
[im 11/15]
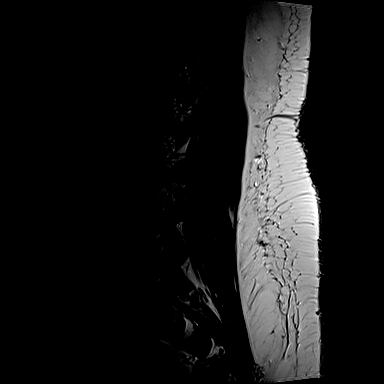
[im 15/15]
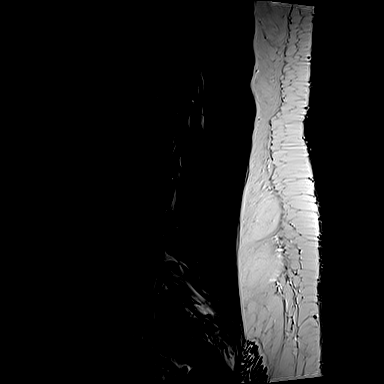

[Series 7: T1 · sagittal · 4.0mm · 0.73mm/px · 3 of 15 slices shown]
[im 1/15]
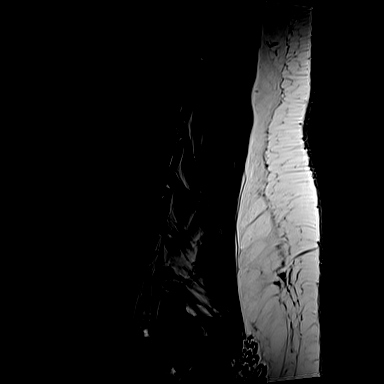
[im 8/15]
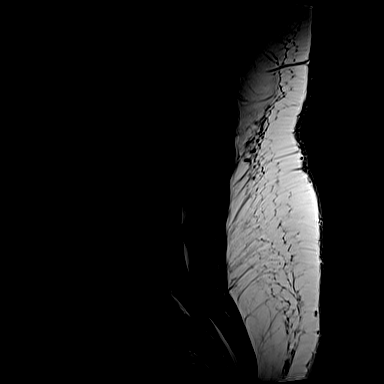
[im 15/15]
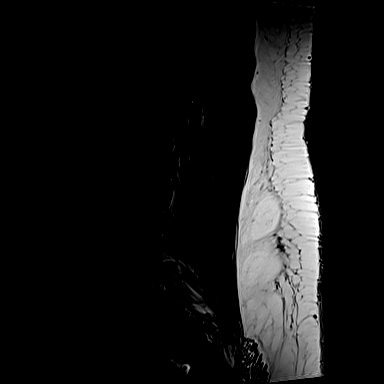

[Series 11: T2 · axial · 4.0mm · 0.28mm/px · z∈[-126,-47]mm · 7 of 20 slices shown (2 of 3)]
[im 1/20]
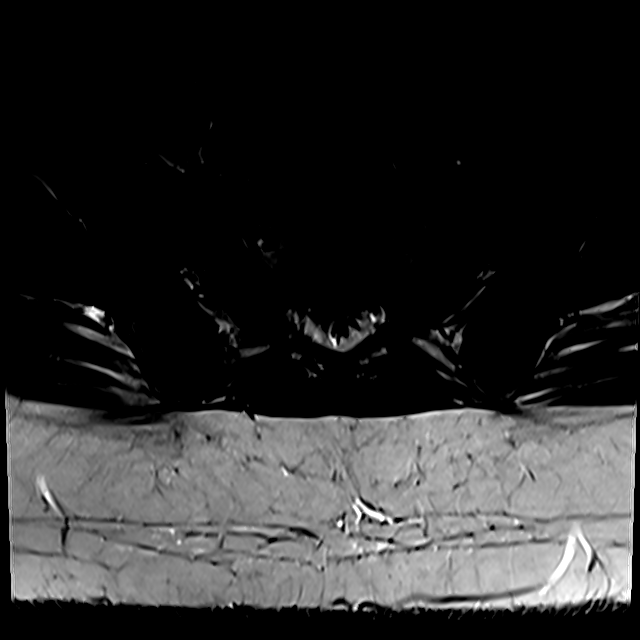
[im 3/20]
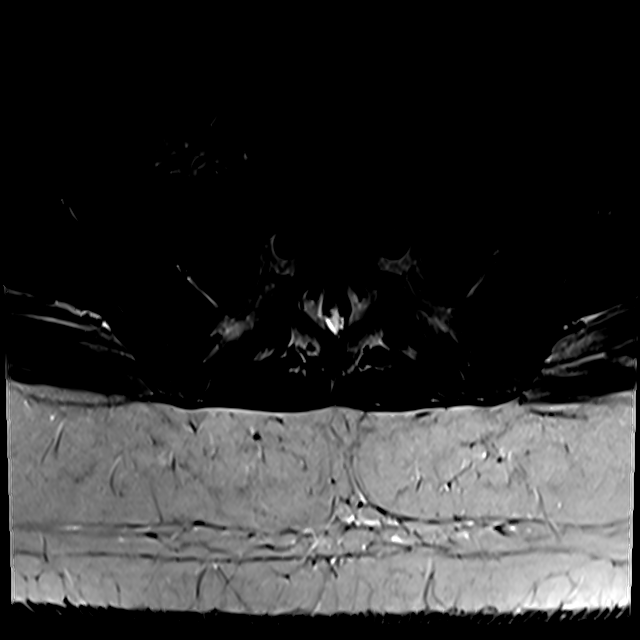
[im 6/20]
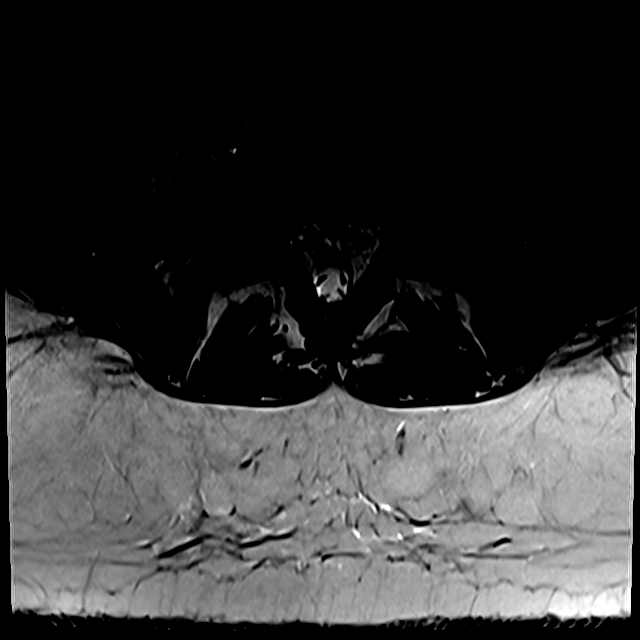
[im 9/20]
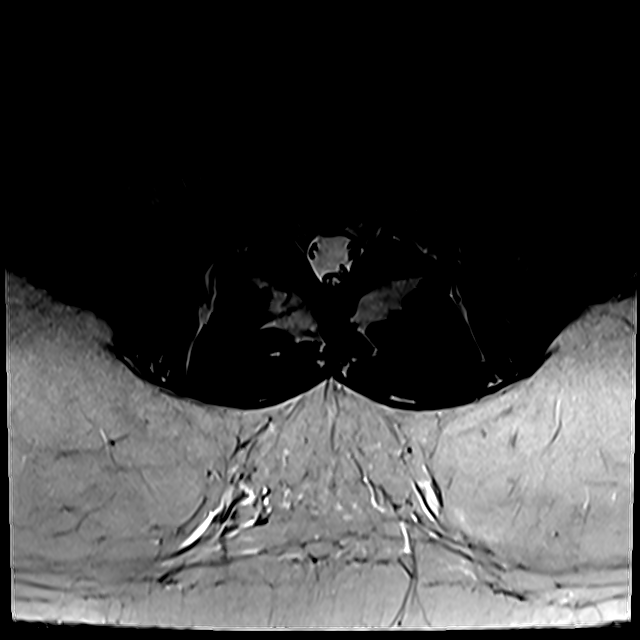
[im 11/20]
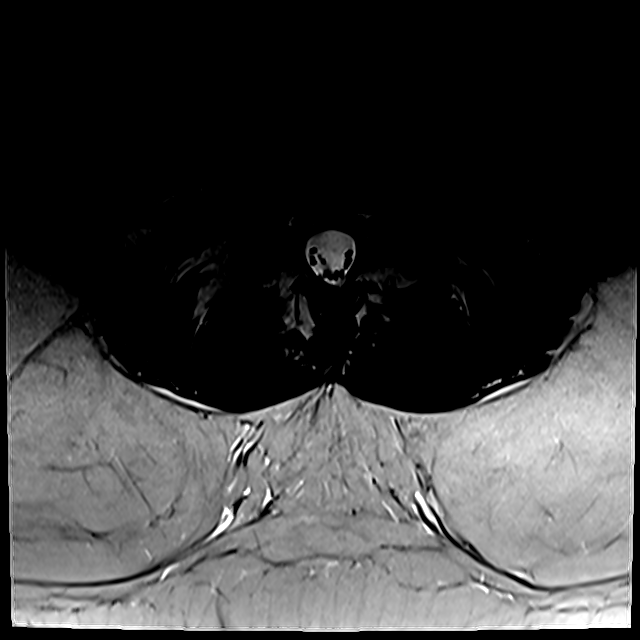
[im 14/20]
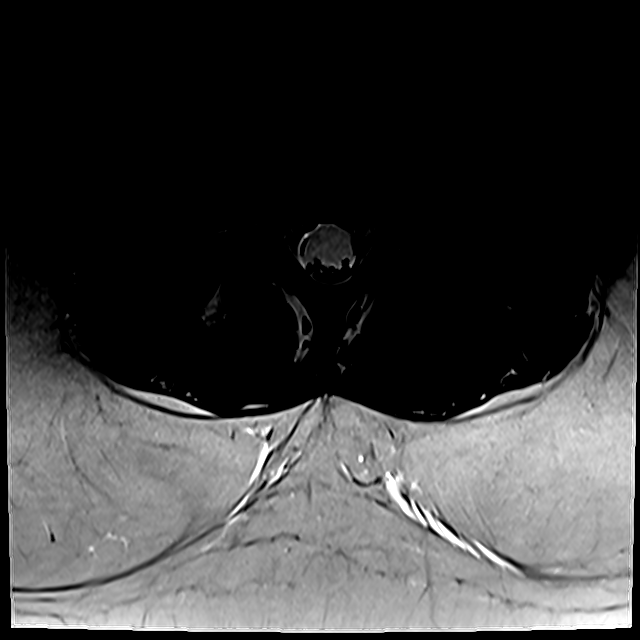
[im 17/20]
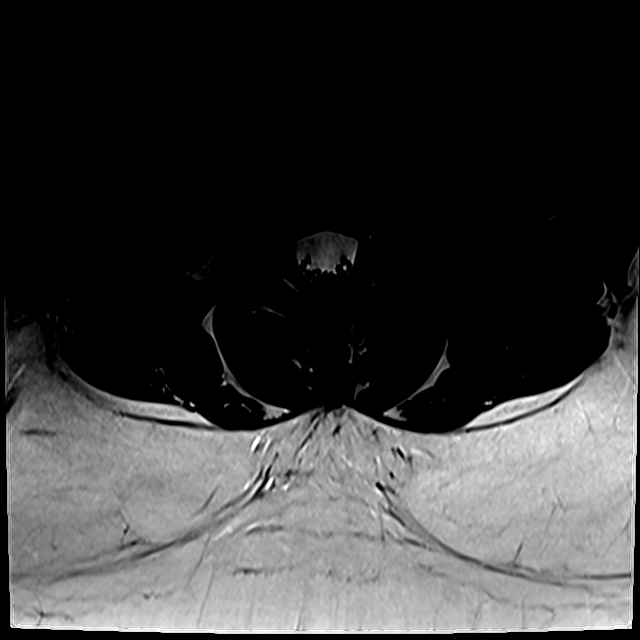

[Series 12: T2 · axial · 4.0mm · 0.28mm/px · z∈[-17,+53]mm · 3 of 20 slices shown (3 of 3)]
[im 3/20]
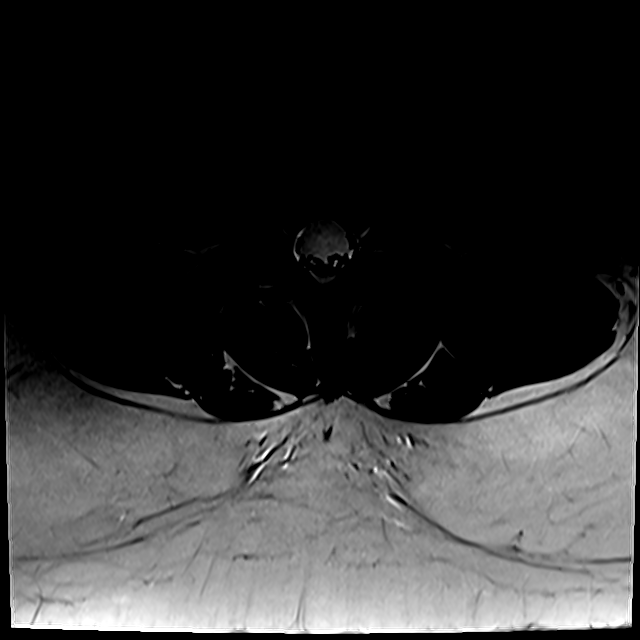
[im 11/20]
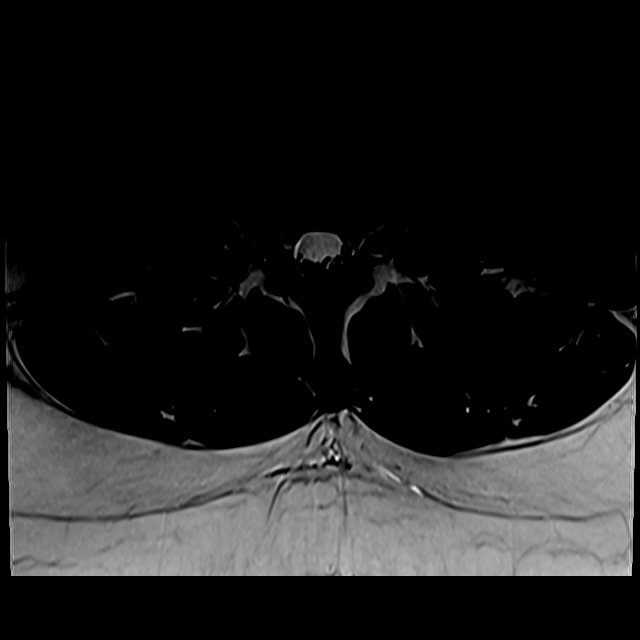
[im 17/20]
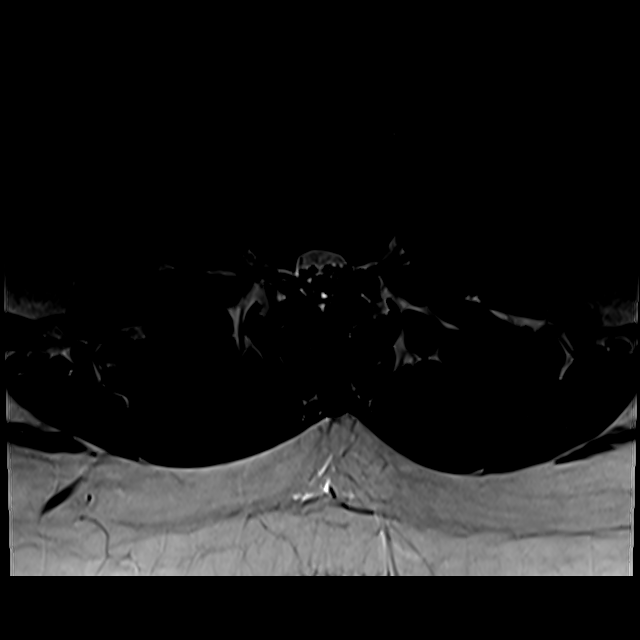

[18 of 48 positions shown; findings below may reference images not displayed]

FINDINGS: Segmentation: Normal. The lowest disc space is considered to be
L5-S1.

Alignment:  Normal

Vertebrae: No acute compression fracture, facet edema or focal
marrow lesion.

Conus medullaris: Extends to the L1 level and appears normal.

Paraspinal and other soft tissues: The visualized aorta, IVC and
iliac vessels are normal. The visualized retroperitoneal organs and
paraspinal soft tissues are normal.

Disc levels:

T12-L1: Evaluated on sagittal images only. No significant disc
herniation, spinal canal stenosis or neuroforaminal narrowing.

L1-L2: Normal disc space and facet joints. No spinal canal stenosis.
No neuroforaminal stenosis.

L2-L3: Normal disc space and facet joints. No spinal canal stenosis.
No neuroforaminal stenosis.

L3-L4: Normal disc space and facet joints. No spinal canal stenosis.
No neuroforaminal stenosis.

L4-L5: Normal disc space and facet joints. No spinal canal stenosis.
No neuroforaminal stenosis.

L5-S1: There is disc desiccation without significant height loss or
bulge. No spinal canal stenosis. No neuroforaminal stenosis.
IMPRESSION: Mild L5-S1 disc degeneration without spinal canal or neural
foraminal stenosis.

Comment: The following findings are so common in people without low
back pain that while we report their presence, they must be
interpreted with caution and in the context of the clinical
situation. (Reference - Jarvik et al, Spine [id number])
FINDINGS: Prevalence in patients without low back pain -

--Disc degeneration (91%)

--Disc T2-signal loss (83%)

--Disc height loss (56%)

--Disc bulge (64%)

--Disc protrusion (32%)

--Annular fissure (38%)

## 2018-09-20 DIAGNOSIS — Z23 Encounter for immunization: Secondary | ICD-10-CM | POA: Diagnosis not present

## 2018-11-20 DIAGNOSIS — G4733 Obstructive sleep apnea (adult) (pediatric): Secondary | ICD-10-CM | POA: Diagnosis not present

## 2018-11-21 DIAGNOSIS — H40233 Intermittent angle-closure glaucoma, bilateral: Secondary | ICD-10-CM | POA: Diagnosis not present

## 2018-11-21 DIAGNOSIS — E119 Type 2 diabetes mellitus without complications: Secondary | ICD-10-CM | POA: Diagnosis not present

## 2019-01-16 DIAGNOSIS — Z0000 Encounter for general adult medical examination without abnormal findings: Secondary | ICD-10-CM | POA: Diagnosis not present

## 2019-01-16 DIAGNOSIS — E559 Vitamin D deficiency, unspecified: Secondary | ICD-10-CM | POA: Diagnosis not present

## 2019-01-16 DIAGNOSIS — E7800 Pure hypercholesterolemia, unspecified: Secondary | ICD-10-CM | POA: Diagnosis not present

## 2019-01-16 DIAGNOSIS — E119 Type 2 diabetes mellitus without complications: Secondary | ICD-10-CM | POA: Diagnosis not present

## 2019-01-16 DIAGNOSIS — Z7984 Long term (current) use of oral hypoglycemic drugs: Secondary | ICD-10-CM | POA: Diagnosis not present

## 2019-01-21 DIAGNOSIS — E559 Vitamin D deficiency, unspecified: Secondary | ICD-10-CM | POA: Diagnosis not present

## 2019-01-21 DIAGNOSIS — Z0000 Encounter for general adult medical examination without abnormal findings: Secondary | ICD-10-CM | POA: Diagnosis not present

## 2019-01-21 DIAGNOSIS — E119 Type 2 diabetes mellitus without complications: Secondary | ICD-10-CM | POA: Diagnosis not present

## 2019-01-21 DIAGNOSIS — K219 Gastro-esophageal reflux disease without esophagitis: Secondary | ICD-10-CM | POA: Diagnosis not present

## 2019-01-21 DIAGNOSIS — E7800 Pure hypercholesterolemia, unspecified: Secondary | ICD-10-CM | POA: Diagnosis not present

## 2019-05-23 DIAGNOSIS — H40233 Intermittent angle-closure glaucoma, bilateral: Secondary | ICD-10-CM | POA: Diagnosis not present

## 2019-07-16 DIAGNOSIS — E7800 Pure hypercholesterolemia, unspecified: Secondary | ICD-10-CM | POA: Diagnosis not present

## 2019-07-16 DIAGNOSIS — E119 Type 2 diabetes mellitus without complications: Secondary | ICD-10-CM | POA: Diagnosis not present

## 2019-07-16 DIAGNOSIS — E559 Vitamin D deficiency, unspecified: Secondary | ICD-10-CM | POA: Diagnosis not present
